# Patient Record
Sex: Male | Born: 1990 | Race: White | Hispanic: No | Marital: Single | Smoking: Never smoker
Health system: Southern US, Community
[De-identification: ages and names within clinical notes are randomized; demographics above are authoritative.]

## PROBLEM LIST (undated history)

## (undated) DIAGNOSIS — I499 Cardiac arrhythmia, unspecified: Secondary | ICD-10-CM

## (undated) DIAGNOSIS — R51 Headache: Secondary | ICD-10-CM

## (undated) DIAGNOSIS — F819 Developmental disorder of scholastic skills, unspecified: Secondary | ICD-10-CM

## (undated) DIAGNOSIS — R202 Paresthesia of skin: Secondary | ICD-10-CM

## (undated) DIAGNOSIS — Z8739 Personal history of other diseases of the musculoskeletal system and connective tissue: Secondary | ICD-10-CM

## (undated) DIAGNOSIS — L989 Disorder of the skin and subcutaneous tissue, unspecified: Secondary | ICD-10-CM

## (undated) DIAGNOSIS — R519 Headache, unspecified: Secondary | ICD-10-CM

## (undated) DIAGNOSIS — K219 Gastro-esophageal reflux disease without esophagitis: Secondary | ICD-10-CM

## (undated) DIAGNOSIS — E669 Obesity, unspecified: Secondary | ICD-10-CM

## (undated) DIAGNOSIS — G4700 Insomnia, unspecified: Secondary | ICD-10-CM

## (undated) DIAGNOSIS — T7840XA Allergy, unspecified, initial encounter: Secondary | ICD-10-CM

## (undated) DIAGNOSIS — F959 Tic disorder, unspecified: Secondary | ICD-10-CM

## (undated) DIAGNOSIS — F419 Anxiety disorder, unspecified: Secondary | ICD-10-CM

## (undated) DIAGNOSIS — B079 Viral wart, unspecified: Secondary | ICD-10-CM

## (undated) HISTORY — DX: Allergy, unspecified, initial encounter: T78.40XA

## (undated) HISTORY — DX: Personal history of other diseases of the musculoskeletal system and connective tissue: Z87.39

## (undated) HISTORY — DX: Tic disorder, unspecified: F95.9

## (undated) HISTORY — DX: Anxiety disorder, unspecified: F41.9

## (undated) HISTORY — DX: Obesity, unspecified: E66.9

## (undated) HISTORY — DX: Gastro-esophageal reflux disease without esophagitis: K21.9

## (undated) HISTORY — DX: Insomnia, unspecified: G47.00

## (undated) HISTORY — DX: Viral wart, unspecified: B07.9

## (undated) HISTORY — DX: Developmental disorder of scholastic skills, unspecified: F81.9

## (undated) HISTORY — DX: Paresthesia of skin: R20.2

## (undated) HISTORY — DX: Disorder of the skin and subcutaneous tissue, unspecified: L98.9

---
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
## 2014-05-06 LAB — LIPID PANEL
CHOLESTEROL: 111 mg/dL (ref 0–200)
HDL: 38 mg/dL (ref 35–70)
LDL Cholesterol: 50 mg/dL
TRIGLYCERIDES: 114 mg/dL (ref 40–160)

## 2015-02-24 ENCOUNTER — Encounter: Payer: Self-pay | Admitting: Family Medicine

## 2015-02-25 ENCOUNTER — Ambulatory Visit (INDEPENDENT_AMBULATORY_CARE_PROVIDER_SITE_OTHER): Payer: 59 | Admitting: Family Medicine

## 2015-02-25 ENCOUNTER — Encounter: Payer: Self-pay | Admitting: Family Medicine

## 2015-02-25 VITALS — BP 122/78 | HR 89 | Temp 98.7°F | Resp 14 | Ht 72.5 in | Wt 228.4 lb

## 2015-02-25 DIAGNOSIS — M549 Dorsalgia, unspecified: Secondary | ICD-10-CM | POA: Diagnosis not present

## 2015-02-25 DIAGNOSIS — E66811 Obesity, class 1: Secondary | ICD-10-CM | POA: Insufficient documentation

## 2015-02-25 DIAGNOSIS — F339 Major depressive disorder, recurrent, unspecified: Secondary | ICD-10-CM | POA: Diagnosis not present

## 2015-02-25 DIAGNOSIS — J302 Other seasonal allergic rhinitis: Secondary | ICD-10-CM | POA: Insufficient documentation

## 2015-02-25 DIAGNOSIS — E669 Obesity, unspecified: Secondary | ICD-10-CM

## 2015-02-25 DIAGNOSIS — F411 Generalized anxiety disorder: Secondary | ICD-10-CM

## 2015-02-25 DIAGNOSIS — J309 Allergic rhinitis, unspecified: Secondary | ICD-10-CM

## 2015-02-25 DIAGNOSIS — J3089 Other allergic rhinitis: Secondary | ICD-10-CM | POA: Insufficient documentation

## 2015-02-25 DIAGNOSIS — G4700 Insomnia, unspecified: Secondary | ICD-10-CM | POA: Insufficient documentation

## 2015-02-25 DIAGNOSIS — K219 Gastro-esophageal reflux disease without esophagitis: Secondary | ICD-10-CM | POA: Diagnosis not present

## 2015-02-25 DIAGNOSIS — F819 Developmental disorder of scholastic skills, unspecified: Secondary | ICD-10-CM

## 2015-02-25 DIAGNOSIS — F958 Other tic disorders: Secondary | ICD-10-CM | POA: Insufficient documentation

## 2015-02-25 HISTORY — DX: Obesity, class 1: E66.811

## 2015-02-25 HISTORY — DX: Obesity, unspecified: E66.9

## 2015-02-25 MED ORDER — ALPRAZOLAM 0.5 MG PO TABS: 0.5000 mg | ORAL_TABLET | Freq: Every day | ORAL | Status: DC | PRN

## 2015-02-25 MED ORDER — FLUTICASONE PROPIONATE 50 MCG/ACT NA SUSP: 2.0000 | Freq: Every day | NASAL | Status: DC

## 2015-02-25 MED ORDER — METAXALONE 800 MG PO TABS: 800.0000 mg | ORAL_TABLET | Freq: Three times a day (TID) | ORAL | Status: DC

## 2015-02-25 NOTE — Patient Instructions (Signed)
Back Pain, Adult Low back pain is very common. About 1 in 5 people have back pain.The cause of low back pain is rarely dangerous. The pain often gets better over time.About half of people with a sudden onset of back pain feel better in just 2 weeks. About 8 in 10 people feel better by 6 weeks.  CAUSES Some common causes of back pain include:  Strain of the muscles or ligaments supporting the spine.  Wear and tear (degeneration) of the spinal discs.  Arthritis.  Direct injury to the back. DIAGNOSIS Most of the time, the direct cause of low back pain is not known.However, back pain can be treated effectively even when the exact cause of the pain is unknown.Answering your caregiver's questions about your overall health and symptoms is one of the most accurate ways to make sure the cause of your pain is not dangerous. If your caregiver needs more information, he or she may order lab work or imaging tests (X-rays or MRIs).However, even if imaging tests show changes in your back, this usually does not require surgery. HOME CARE INSTRUCTIONS For many people, back pain returns.Since low back pain is rarely dangerous, it is often a condition that people can learn to manageon their own.   Remain active. It is stressful on the back to sit or stand in one place. Do not sit, drive, or stand in one place for more than 30 minutes at a time. Take short walks on level surfaces as soon as pain allows.Try to increase the length of time you walk each day.  Do not stay in bed.Resting more than 1 or 2 days can delay your recovery.  Do not avoid exercise or work.Your body is made to move.It is not dangerous to be active, even though your back may hurt.Your back will likely heal faster if you return to being active before your pain is gone.  Pay attention to your body when you bend and lift. Many people have less discomfortwhen lifting if they bend their knees, keep the load close to their bodies,and  avoid twisting. Often, the most comfortable positions are those that put less stress on your recovering back.  Find a comfortable position to sleep. Use a firm mattress and lie on your side with your knees slightly bent. If you lie on your back, put a pillow under your knees.  Only take over-the-counter or prescription medicines as directed by your caregiver. Over-the-counter medicines to reduce pain and inflammation are often the most helpful.Your caregiver may prescribe muscle relaxant drugs.These medicines help dull your pain so you can more quickly return to your normal activities and healthy exercise.  Put ice on the injured area.  Put ice in a plastic bag.  Place a towel between your skin and the bag.  Leave the ice on for 15-20 minutes, 03-04 times a day for the first 2 to 3 days. After that, ice and heat may be alternated to reduce pain and spasms.  Ask your caregiver about trying back exercises and gentle massage. This may be of some benefit.  Avoid feeling anxious or stressed.Stress increases muscle tension and can worsen back pain.It is important to recognize when you are anxious or stressed and learn ways to manage it.Exercise is a great option. SEEK MEDICAL CARE IF:  You have pain that is not relieved with rest or medicine.  You have pain that does not improve in 1 week.  You have new symptoms.  You are generally not feeling well. SEEK   IMMEDIATE MEDICAL CARE IF:   You have pain that radiates from your back into your legs.  You develop new bowel or bladder control problems.  You have unusual weakness or numbness in your arms or legs.  You develop nausea or vomiting.  You develop abdominal pain.  You feel faint. Document Released: 09/04/2005 Document Revised: 03/05/2012 Document Reviewed: 01/06/2014 Exit[employer] Patient Information 2015 [employer]. This information is not intended to replace advice given to you by your health care provider. Make sure you  discuss any questions you have with your health care provider.  

## 2015-02-25 NOTE — Progress Notes (Signed)
Name: Mason Parker   MRN: [id number]    DOB: 03-30-1991   Date:02/25/2015       Progress Note  Subjective  Chief Complaint  Chief Complaint  Patient presents with  . Anxiety    unchanged  . Back Pain    upper back since sunday.  He has been shouveling gravel/dirt and doing yard work    HPI  Acute low back pain: he was shoveling gravel and dirt on Sunday and that evening he noticed back pain on left upper back pain, described as an aching feeling, worse with movement/activity and better with rest and heating pad applications. Pain at this time is 3/10, but can get up to 9/10. Pain does not radiated anywhere.  GAD: he has a new puppy and wants to train the puppy for stress relieve, he has been off paroxetine, and has only been taking alprazolam prn, but he needs follow up. He continues to see therapist , Rob at [facility], every two weeks.   GERD: it is controlled with life style modification, not current taking medication  Depression: he states he does not have friends his age, and he feels lonely, denies suicidal thoughts or ideation. He was taking Paroxetine, but stopped, he wants to be helped by his emotional support animal. He prefers not to take medication, and continue counseling     Patient Active Problem List   Diagnosis Date Noted  . Anxiety, generalized 02/25/2015  . Insomnia 02/25/2015  . Gastro-esophageal reflux disease without esophagitis 02/25/2015  . Chronic recurrent major depressive disorder 02/25/2015  . Obesity (BMI 30.0-34.9) 02/25/2015  . 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 Perennial allergic rhinitis with seasonal variation 02/25/2015  . Learning disability 02/25/2015    Past Surgical History  Procedure Laterality Date  . Circumcision  04/11/2007    Family History  Problem Relation Age of Onset  . Hypertension Mother   . Heart disease Mother   . Depression Mother   . Hypertension Father   . Bipolar disorder Father   .  Hyperlipidemia Father   . Obesity Father   . Bipolar disorder Sister   . Depression Brother     History   Social History  . Marital Status: Single    Spouse Name: N/A  . Number of Children: N/A  . Years of Education: N/A   Occupational History  . Not on file.   Social History Main Topics  . Smoking status: Never Smoker   . Smokeless tobacco: Never Used  . Alcohol Use: No  . Drug Use: No  . Sexual Activity: Not Currently   Other Topics Concern  . Not on file   Social History Narrative     Current outpatient prescriptions:  .  ALPRAZolam (XANAX) 0.5 MG tablet, Take 1 tablet (0.5 mg total) by mouth daily as needed for anxiety., Disp: 30 tablet, Rfl: 0 .  loratadine (CLARITIN) 10 MG tablet, Take 1 tablet by mouth daily., Disp: , Rfl:  .  fluticasone (FLONASE) 50 MCG/ACT nasal spray, Place 2 sprays into both nostrils daily., Disp: 16 g, Rfl: 5  Allergies  Allergen Reactions  . Shellfish Allergy Hives     ROS  Constitutional: Negative for fever but mild weight change - gain.  Respiratory: Negative for cough and shortness of breath.   Cardiovascular: Negative for chest pain or palpitations.  Gastrointestinal: Negative for abdominal pain, no bowel changes.  Musculoskeletal: Negative for gait problem or joint swelling.  Skin: Negative for rash.  Neurological:  Negative for dizziness or headache.  No other specific complaints in a complete review of systems (except as listed in HPI above).  Objective  Filed Vitals:   02/25/15 1459  BP: 122/78  Pulse: 89  Temp: 98.7 F (37.1 C)  TempSrc: Oral  Resp: 14  Height: 6' 0.5" (1.842 m)  Weight: 228 lb 6.4 oz (103.602 kg)  SpO2: 97%    Body mass index is 30.53 kg/(m^2).  Physical Exam  Constitutional: Patient appears well-developed and well-nourished. No distress.  HENT: Head: Normocephalic and atraumatic. Nose: Nose normal. Mouth/Throat: Oropharynx is clear and moist. No oropharyngeal exudate.  Eyes:  Conjunctivae and EOM are normal. Pupils are equal, round, and reactive to light. No scleral icterus.  Neck: Normal range of motion. Neck supple. No JVD present. No thyromegaly present.  Cardiovascular: Normal rate, regular rhythm and normal heart sounds.  No murmur heard. No BLE edema. Pulmonary/Chest: Effort normal and breath sounds normal. No respiratory distress. Abdominal: Soft. Bowel sounds are normal, no distension. There is no tenderness. no masses Musculoskeletal: Normal range of motion, no joint effusions. No gross deformities, mild pain during palpation of left upper back Neurological: he is alert and oriented to person, place, and time. No cranial nerve deficit. Coordination, balance, strength, speech and gait are normal.  Skin: Skin is warm and dry. No rash noted. No erythema.  Psychiatric: Patient has a normal mood and affect. behavior is normal. Judgment and thought content normal.  PHQ2/9: Depression screen PHQ 2/9 02/25/2015  Decreased Interest 1  Down, Depressed, Hopeless 2  PHQ - 2 Score 3  Altered sleeping 3  Tired, decreased energy 3  Change in appetite 1  Feeling bad or failure about yourself  1  Trouble concentrating 0  Moving slowly or fidgety/restless 1  Suicidal thoughts 0  PHQ-9 Score 12  Difficult doing work/chores Somewhat difficult     Fall Risk: Fall Risk  02/25/2015  Falls in the past year? No    Assessment & Plan   1. Acute back pain Advised heating pad and skelaxin for spasms - metaxalone (SKELAXIN) 800 MG tablet; Take 1 tablet (800 mg total) by mouth 3 (three) times daily.  Dispense: 30 tablet; Refill: 0  2. Chronic recurrent major depressive disorder  Using his dog as an emotional dog support - he is off paroxetine and does not want to take SSRI's at this time, continue follow up with therapist 3. Gastro-esophageal reflux disease without esophagitis  controlled with life style modification  4. Anxiety, generalized Taking medication prn -  ALPRAZolam (XANAX) 0.5 MG tablet; Take 1 tablet (0.5 mg total) by mouth daily as needed for anxiety.  Dispense: 30 tablet; Refill: 0  5. Learning disability [facility], he still requires extended time for tests  6. Perennial allergic rhinitis with seasonal variation  - fluticasone (FLONASE) 50 MCG/ACT nasal spray; Place 2 sprays into both nostrils daily.  Dispense: 16 g; Refill: 5

## 2015-05-05 ENCOUNTER — Ambulatory Visit (INDEPENDENT_AMBULATORY_CARE_PROVIDER_SITE_OTHER): Payer: 59 | Admitting: Family Medicine

## 2015-05-05 ENCOUNTER — Encounter: Payer: Self-pay | Admitting: Family Medicine

## 2015-05-05 VITALS — BP 138/78 | HR 116 | Temp 98.7°F | Resp 16 | Ht 73.0 in | Wt 230.4 lb

## 2015-05-05 DIAGNOSIS — L0201 Cutaneous abscess of face: Secondary | ICD-10-CM

## 2015-05-05 DIAGNOSIS — L03211 Cellulitis of face: Secondary | ICD-10-CM | POA: Diagnosis not present

## 2015-05-05 MED ORDER — AMOXICILLIN-POT CLAVULANATE 875-125 MG PO TABS: 1.0000 | ORAL_TABLET | Freq: Two times a day (BID) | ORAL | Status: DC

## 2015-05-05 NOTE — Progress Notes (Signed)
Name: Mason Parker   MRN: [id number]    DOB: 1991-05-27   Date:05/05/2015       Progress Note  Subjective  Chief Complaint  Chief Complaint  Patient presents with  . Facial Swelling    facial swelling on left side possible due to ingrown hair for 2 days.    HPI  Patient presents with a 5-7 day history of pain and swelling in the right mandibular area. He is well-appearing and thinks he had an ingrown hair as well. This been no fever or chills myalgias. He denies any oral lesions or dental problems.    Past Medical History  Diagnosis Date  . Learning disability   . Personal history of dislocation of shoulder   . Viral wart   . GERD (gastroesophageal reflux disease)   . Anxiety   . Allergy   . Insomnia   . Tic disorder   . Skin lesion   . Right hand paresthesia   . Obesity (BMI 30.0-34.9) 02/25/2015    Social History  Substance Use Topics  . Smoking status: Never Smoker   . Smokeless tobacco: Never Used  . Alcohol Use: No     Current outpatient prescriptions:  .  ALPRAZolam (XANAX) 0.5 MG tablet, Take 1 tablet (0.5 mg total) by mouth daily as needed for anxiety., Disp: 30 tablet, Rfl: 0 .  fluticasone (FLONASE) 50 MCG/ACT nasal spray, Place 2 sprays into both nostrils daily., Disp: 16 g, Rfl: 5 .  loratadine (CLARITIN) 10 MG tablet, Take 1 tablet by mouth daily., Disp: , Rfl:  .  metaxalone (SKELAXIN) 800 MG tablet, Take 1 tablet (800 mg total) by mouth 3 (three) times daily. (Patient not taking: Reported on 05/05/2015), Disp: 30 tablet, Rfl: 0  Allergies  Allergen Reactions  . Shellfish Allergy Hives    Review of Systems  Constitutional: Negative for fever, chills, weight loss and malaise/fatigue.  HENT:       Negative for dental or gingival pain  Eyes: Negative.   Cardiovascular: Negative.   Skin: Positive for rash.       Right facial lesion     Objective  Filed Vitals:   05/05/15 0958  BP: 138/78  Pulse: 116  Temp: 98.7 F (37.1 C)  Resp: 16   Height:  (1.854 m)  Weight: 230 lb 7 oz (104.526 kg)  SpO2: 97%     Physical Exam    Assessment & Plan  1. Cellulitis and abscess of face Differential diagnosis includes simple abscess and ingrown hair versus ENT mucocyst or malignancy which is much less likely - amoxicillin-clavulanate (AUGMENTIN) 875-125 MG per tablet; Take 1 tablet by mouth 2 (two) times daily.  Dispense: 20 tablet; Refill: 0

## 2015-05-05 NOTE — Patient Instructions (Signed)

## 2015-06-03 ENCOUNTER — Ambulatory Visit (INDEPENDENT_AMBULATORY_CARE_PROVIDER_SITE_OTHER): Payer: 59 | Admitting: Family Medicine

## 2015-06-03 ENCOUNTER — Encounter: Payer: Self-pay | Admitting: Family Medicine

## 2015-06-03 VITALS — BP 132/84 | HR 102 | Temp 98.8°F | Resp 16 | Ht 73.0 in | Wt 227.9 lb

## 2015-06-03 DIAGNOSIS — F411 Generalized anxiety disorder: Secondary | ICD-10-CM | POA: Diagnosis not present

## 2015-06-03 DIAGNOSIS — F819 Developmental disorder of scholastic skills, unspecified: Secondary | ICD-10-CM

## 2015-06-03 DIAGNOSIS — Z23 Encounter for immunization: Secondary | ICD-10-CM | POA: Diagnosis not present

## 2015-06-03 DIAGNOSIS — E669 Obesity, unspecified: Secondary | ICD-10-CM

## 2015-06-03 DIAGNOSIS — F339 Major depressive disorder, recurrent, unspecified: Secondary | ICD-10-CM | POA: Diagnosis not present

## 2015-06-03 DIAGNOSIS — K219 Gastro-esophageal reflux disease without esophagitis: Secondary | ICD-10-CM | POA: Diagnosis not present

## 2015-06-03 DIAGNOSIS — S43004S Unspecified dislocation of right shoulder joint, sequela: Secondary | ICD-10-CM

## 2015-06-03 DIAGNOSIS — B078 Other viral warts: Secondary | ICD-10-CM | POA: Insufficient documentation

## 2015-06-03 DIAGNOSIS — B079 Viral wart, unspecified: Secondary | ICD-10-CM | POA: Insufficient documentation

## 2015-06-03 NOTE — Progress Notes (Signed)
Name: Mason Parker   MRN: [id number]    DOB: January 06, 1991   Date:06/03/2015       Progress Note  Subjective  Chief Complaint  Chief Complaint  Patient presents with  . Medication Refill    3 month F/U  . Anxiety  . Shoulder Pain    not realy painful just pops out os socket request to see surgeon    HPI    Right shoulder dislocation history and instability: he injured the right shoulder 10 years ago , had PT done at the time and continues to have intermittent dislocation of right shoulder, not painful, but he would like to see Ortho to see if anything else can be done  GERD: controlled with life style modification, has a flare when he eats spicy food/ or citric food. Taking Zantac or Tums prn , at most twice weekly.   Obesity: lost a few pounds since last visit, by eating healthy meals. States he will go back to exercising when it gets cooler outside  Anxiety/Depression: still seeing therapist, doing well, dating again - back with his first girlfriend - Mason Parker - only took a few tablets of Alprazolam since last visit. Off SSRI, but does not want to go back. He gets emotional at times.    Patient Active Problem List   Diagnosis Date Noted  . Verruca warts (infectious) 06/03/2015  . Anxiety, generalized 02/25/2015  . Insomnia 02/25/2015  . Gastro-esophageal reflux disease without esophagitis 02/25/2015  . Chronic recurrent major depressive disorder 02/25/2015  . Obesity (BMI 30.0-34.9) 02/25/2015  . 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 Perennial allergic rhinitis with seasonal variation 02/25/2015  . Learning disability 02/25/2015    Past Surgical History  Procedure Laterality Date  . Circumcision  04/11/2007    Family History  Problem Relation Age of Onset  . Hypertension Mother   . Heart disease Mother   . Depression Mother   . Hypertension Father   . Bipolar disorder Father   . Hyperlipidemia Father   . Obesity Father   . Bipolar disorder Sister   . Depression Brother      Social History   Social History  . Marital Status: Single    Spouse Name: N/A  . Number of Children: N/A  . Years of Education: N/A   Occupational History  . Not on file.   Social History Main Topics  . Smoking status: Never Smoker   . Smokeless tobacco: Never Used  . Alcohol Use: No  . Drug Use: No  . Sexual Activity: Not Currently   Other Topics Concern  . Not on file   Social History Narrative     Current outpatient prescriptions:  .  ALPRAZolam (XANAX) 0.5 MG tablet, Take 1 tablet (0.5 mg total) by mouth daily as needed for anxiety., Disp: 30 tablet, Rfl: 0 .  fluticasone (FLONASE) 50 MCG/ACT nasal spray, Place 2 sprays into both nostrils daily., Disp: 16 g, Rfl: 5 .  loratadine (CLARITIN) 10 MG tablet, Take 1 tablet by mouth daily., Disp: , Rfl:   Allergies  Allergen Reactions  . Shellfish Allergy Hives     ROS  Constitutional: Negative for fever or significant weight change.  Respiratory: Negative for cough and shortness of breath.   Cardiovascular: Negative for chest pain or palpitations.  Gastrointestinal: Negative for abdominal pain, no bowel changes.  Musculoskeletal: Negative for gait problem or joint swelling.  Skin: Negative for rash.  Neurological: Negative for dizziness or headache.  No other specific complaints in a  complete review of systems (except as listed in HPI above).  Objective  Filed Vitals:   06/03/15 1409  BP: 132/84  Pulse: 102  Temp: 98.8 F (37.1 C)  TempSrc: Oral  Resp: 16  Height:  (1.854 m)  Weight: 227 lb 14.4 oz (103.375 kg)  SpO2: 97%    Body mass index is 30.07 kg/(m^2).  Physical Exam  Constitutional: Patient appears well-developed and well-nourished. Obese No distress.  HEENT: head atraumatic, normocephalic, pupils equal and reactive to light, neck supple, throat within normal limits Cardiovascular: Normal rate, regular rhythm and normal heart sounds.  No murmur heard. No BLE  edema. Pulmonary/Chest: Effort normal and breath sounds normal. No respiratory distress. Abdominal: Soft.  There is no tenderness. Psychiatric: Patient has a normal mood and affect. behavior is normal. Judgment and thought content normal.  PHQ2/9: Depression screen [facility] 2/9 05/05/2015 02/25/2015  Decreased Interest 0 1  Down, Depressed, Hopeless 0 2  PHQ - 2 Score 0 3  Altered sleeping - 3  Tired, decreased energy - 3  Change in appetite - 1  Feeling bad or failure about yourself  - 1  Trouble concentrating - 0  Moving slowly or fidgety/restless - 1  Suicidal thoughts - 0  PHQ-9 Score - 12  Difficult doing work/chores - Somewhat difficult    Fall Risk: Fall Risk  05/05/2015 02/25/2015  Falls in the past year? No No     Assessment & Plan  1. Gastro-esophageal reflux disease without esophagitis  Continue prn medication and life style modification   2. Anxiety, generalized  Continue follow with therapist, doing better  3. Needs flu shot  - Flu Vaccine QUAD 36+ mos PF IM (Fluarix & Fluzone Quad PF)  4. Learning disability  He is in school - college - he has a 504 filled out - extended time for testing, and can sometimes have read aloud for him   5. Chronic recurrent major depressive disorder  stable , off medication  6. Obesity (BMI 30.0-34.9)  Continue life style modification

## 2015-06-16 ENCOUNTER — Other Ambulatory Visit: Payer: Self-pay | Admitting: Orthopedic Surgery

## 2015-06-16 DIAGNOSIS — M24411 Recurrent dislocation, right shoulder: Secondary | ICD-10-CM

## 2015-06-30 ENCOUNTER — Ambulatory Visit
Admission: RE | Admit: 2015-06-30 | Discharge: 2015-06-30 | Disposition: A | Discharge status: Home / Self Care | Payer: 59 | Source: Ambulatory Visit | Attending: Orthopedic Surgery | Admitting: Orthopedic Surgery

## 2015-06-30 ENCOUNTER — Other Ambulatory Visit: Payer: Self-pay

## 2015-06-30 DIAGNOSIS — M24411 Recurrent dislocation, right shoulder: Secondary | ICD-10-CM | POA: Insufficient documentation

## 2015-06-30 DIAGNOSIS — S43491A Other sprain of right shoulder joint, initial encounter: Secondary | ICD-10-CM | POA: Insufficient documentation

## 2015-06-30 DIAGNOSIS — X58XXXA Exposure to other specified factors, initial encounter: Secondary | ICD-10-CM | POA: Diagnosis not present

## 2015-06-30 MED ORDER — SODIUM CHLORIDE 0.9 % IJ SOLN: 5.0000 mL | INTRAMUSCULAR | Status: DC | PRN

## 2015-06-30 MED ORDER — IOHEXOL 180 MG/ML  SOLN
0.5000 mL | Freq: Once | INTRAMUSCULAR | Status: DC | PRN
  Administered 2015-06-30: 0.5 mL via INTRA_ARTICULAR
  Filled 2015-06-30: qty 20

## 2015-06-30 MED ORDER — GADOBENATE DIMEGLUMINE 529 MG/ML IV SOLN
0.1000 mL | Freq: Once | INTRAVENOUS | Status: DC | PRN
  Administered 2015-06-30: 0.1 mL via INTRA_ARTICULAR
  Filled 2015-06-30: qty 5

## 2015-07-16 ENCOUNTER — Encounter: Payer: Self-pay | Admitting: Family Medicine

## 2015-07-16 DIAGNOSIS — M24411 Recurrent dislocation, right shoulder: Secondary | ICD-10-CM | POA: Insufficient documentation

## 2015-08-25 ENCOUNTER — Encounter: Payer: Self-pay | Admitting: *Deleted

## 2015-08-25 ENCOUNTER — Other Ambulatory Visit: Payer: 59

## 2015-08-25 NOTE — Patient Instructions (Signed)
  Your procedure is scheduled on: 09-09-15 (THURSDAY) Report to [facility] 2ND FLOOR To find out your arrival time please call [phone] between 1PM - 3PM on 09-08-15 [facility](WEDNESDAY)  Remember: Instructions that are not followed completely may result in serious medical risk, up to and including death, or upon the discretion of your surgeon and anesthesiologist your surgery may need to be rescheduled.    __X__ 1. Do not eat food or drink liquids after midnight. No gum chewing or hard candies.     __X__ 2. No Alcohol for 24 hours before or after surgery.   ____ 3. Bring all medications with you on the day of surgery if instructed.    ____ 4. Notify your doctor if there is any change in your medical condition     (cold, fever, infections).     Do not wear jewelry, make-up, hairpins, clips or nail polish.  Do not wear lotions, powders, or perfumes. You may wear deodorant.  Do not shave 48 hours prior to surgery. Men may shave face and neck.  Do not bring valuables to the hospital.    [facility] not responsible for any belongings or valuables.               Contacts, dentures or bridgework may not be worn into surgery.  Leave your suitcase in the car. After surgery it may be brought to your room.  For patients admitted to the hospital, discharge time is determined by your treatment team.   Patients discharged the day of surgery will not be allowed to drive home.   Please read over the following fact sheets that you were given:      _X___ Take these medicines the morning of surgery with A SIP OF WATER:    1. MAY TAKE XANAX IF NEEDED  2.   3.   4.  5.  6.  ____ Fleet Enema (as directed)   _X___ Use CHG Soap as directed  ____ Use inhalers on the day of surgery  ____ Stop metformin 2 days prior to surgery    ____ Take 1/2 of usual insulin dose the night before surgery and none on the morning of surgery.   ____ Stop Coumadin/Plavix/aspirin-N/A  ____  Stop Anti-inflammatories-NO NSAIDS OR ASPIRIN PRODUCTS-TYLENOL OK   ____ Stop supplements until after surgery.    ____ Bring C-Pap to the hospital.

## 2015-08-30 ENCOUNTER — Ambulatory Visit (INDEPENDENT_AMBULATORY_CARE_PROVIDER_SITE_OTHER): Payer: 59 | Admitting: Family Medicine

## 2015-08-30 ENCOUNTER — Encounter: Payer: Self-pay | Admitting: Family Medicine

## 2015-08-30 ENCOUNTER — Encounter
Admission: RE | Admit: 2015-08-30 | Discharge: 2015-08-30 | Disposition: A | Discharge status: Home / Self Care | Payer: 59 | Source: Ambulatory Visit | Attending: Orthopedic Surgery | Admitting: Orthopedic Surgery

## 2015-08-30 VITALS — BP 138/92 | HR 99 | Temp 99.0°F | Resp 16 | Ht 72.0 in | Wt 232.7 lb

## 2015-08-30 DIAGNOSIS — R030 Elevated blood-pressure reading, without diagnosis of hypertension: Secondary | ICD-10-CM

## 2015-08-30 DIAGNOSIS — F339 Major depressive disorder, recurrent, unspecified: Secondary | ICD-10-CM

## 2015-08-30 DIAGNOSIS — IMO0001 Reserved for inherently not codable concepts without codable children: Secondary | ICD-10-CM

## 2015-08-30 DIAGNOSIS — M24411 Recurrent dislocation, right shoulder: Secondary | ICD-10-CM

## 2015-08-30 DIAGNOSIS — Z01812 Encounter for preprocedural laboratory examination: Secondary | ICD-10-CM | POA: Diagnosis present

## 2015-08-30 DIAGNOSIS — F411 Generalized anxiety disorder: Secondary | ICD-10-CM

## 2015-08-30 LAB — URINALYSIS COMPLETE WITH MICROSCOPIC (ARMC ONLY)
BACTERIA UA: NONE SEEN
Bilirubin Urine: NEGATIVE
GLUCOSE, UA: NEGATIVE mg/dL
Ketones, ur: NEGATIVE mg/dL
Leukocytes, UA: NEGATIVE
Nitrite: NEGATIVE
PROTEIN: NEGATIVE mg/dL
SPECIFIC GRAVITY, URINE: 1.026 (ref 1.005–1.030)
SQUAMOUS EPITHELIAL / LPF: NONE SEEN
pH: 6 (ref 5.0–8.0)

## 2015-08-30 LAB — CBC
HCT: 44.2 % (ref 40.0–52.0)
Hemoglobin: 15.2 g/dL (ref 13.0–18.0)
MCH: 29 pg (ref 26.0–34.0)
MCHC: 34.4 g/dL (ref 32.0–36.0)
MCV: 84.2 fL (ref 80.0–100.0)
PLATELETS: 289 10*3/uL (ref 150–440)
RBC: 5.25 MIL/uL (ref 4.40–5.90)
RDW: 12.4 % (ref 11.5–14.5)
WBC: 16.8 10*3/uL — ABNORMAL HIGH (ref 3.8–10.6)

## 2015-08-30 LAB — BASIC METABOLIC PANEL
Anion gap: 8 (ref 5–15)
BUN: 21 mg/dL — AB (ref 6–20)
CALCIUM: 9.3 mg/dL (ref 8.9–10.3)
CHLORIDE: 108 mmol/L (ref 101–111)
CO2: 25 mmol/L (ref 22–32)
CREATININE: 1.25 mg/dL — AB (ref 0.61–1.24)
GFR calc non Af Amer: >60 mL/min (ref >60)
Glucose, Bld: 95 mg/dL (ref 65–99)
Potassium: 3.6 mmol/L (ref 3.5–5.1)
SODIUM: 141 mmol/L (ref 135–145)

## 2015-08-30 LAB — APTT: aPTT: 30 seconds (ref 24–36)

## 2015-08-30 LAB — PROTIME-INR
INR: 1.09
PROTHROMBIN TIME: 14.3 s (ref 11.4–15.0)

## 2015-08-30 MED ORDER — ALPRAZOLAM ER 0.5 MG PO TB24: 0.5000 mg | ORAL_TABLET | Freq: Every day | ORAL | Status: DC

## 2015-08-30 MED ORDER — PAROXETINE HCL 40 MG PO TABS: 40.0000 mg | ORAL_TABLET | Freq: Every day | ORAL | Status: DC

## 2015-08-30 NOTE — Progress Notes (Signed)
Name: Mason Parker   MRN: [id number]    DOB: 12/06/1990   Date:08/30/2015       Progress Note  Subjective  Chief Complaint  Chief Complaint  Patient presents with  . Medication Refill    follow-up  . Depression    not currently on  . Anxiety    HPI   Right shoulder dislocation history and instability: he injured the right shoulder 10 years ago , had PT done at the time and continues to have intermittent dislocation of right shoulder, seen by Ortho - Dr. Martha Clan  and will have surgery next week.   GERD: controlled with life style modification, has a flare when he eats spicy food/ or citric food. Taking Zantac or Tums prn , at most twice weekly.   Obesity: he has gained a lot of weight since last visit. He states he is not following a healthy diet lately.   Major Depression and GAD: still seeing therapist Not currently dating [name] again - casually seeing each other. He has stopped taking all his medications. States he has forgotten to take it, but wants to start it again. Getting more anxious again, having surgery next week, also worried about his finals at school.   Patient Active Problem List   Diagnosis Date Noted  . Recurrent dislocation of right shoulder 07/16/2015  . Verruca warts (infectious) 06/03/2015  . Anxiety, generalized 02/25/2015  . Insomnia 02/25/2015  . Gastro-esophageal reflux disease without esophagitis 02/25/2015  . Chronic recurrent major depressive disorder (HCC) 02/25/2015  . Obesity (BMI 30.0-34.9) 02/25/2015  . 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 Perennial allergic rhinitis with seasonal variation 02/25/2015  . Learning disability 02/25/2015    Past Surgical History  Procedure Laterality Date  . No past surgeries    . Circumcision  1991    Family History  Problem Relation Age of Onset  . Hypertension Mother   . Heart disease Mother   . Depression Mother   . Hypertension Father   . Bipolar disorder Father   . Hyperlipidemia Father   .  Obesity Father   . Bipolar disorder Sister   . Depression Brother     Social History   Social History  . Marital Status: Single    Spouse Name: N/A  . Number of Children: N/A  . Years of Education: N/A   Occupational History  . Not on file.   Social History Main Topics  . Smoking status: Never Smoker   . Smokeless tobacco: Never Used  . Alcohol Use: No  . Drug Use: No  . Sexual Activity: Not Currently   Other Topics Concern  . Not on file   Social History Narrative     Current outpatient prescriptions:  .  ALPRAZolam (XANAX XR) 0.5 MG 24 hr tablet, Take 1 tablet (0.5 mg total) by mouth daily., Disp: 30 tablet, Rfl: 2 .  ALPRAZolam (XANAX) 0.5 MG tablet, Take 1 tablet (0.5 mg total) by mouth daily as needed for anxiety., Disp: 30 tablet, Rfl: 0 .  PARoxetine (PAXIL) 40 MG tablet, Take 1 tablet (40 mg total) by mouth at bedtime., Disp: 30 tablet, Rfl: 2  Allergies  Allergen Reactions  . Other Swelling    YOGURT  . Shellfish Allergy Hives     ROS  Constitutional: Negative for fever, positive for  weight change.  Respiratory: Negative for cough and shortness of breath.   Cardiovascular: Negative for chest pain or palpitations.  Gastrointestinal: Negative for abdominal pain, no bowel changes.  Musculoskeletal:  Negative for gait problem or joint swelling.  Skin: Negative for rash.  Neurological: Negative for dizziness or headache.  No other specific complaints in a complete review of systems (except as listed in HPI above).  Objective  Filed Vitals:   08/30/15 1452  BP: 142/84  Pulse: 99  Temp: 99 F (37.2 C)  TempSrc: Oral  Resp: 16  Height: 6' (1.829 m)  Weight: 232 lb 11.2 oz (105.552 kg)  SpO2: 97%    Body mass index is 31.55 kg/(m^2).  Physical Exam  Constitutional: Patient appears well-developed and well-nourished. Obese No distress.  HEENT: head atraumatic, normocephalic, pupils equal and reactive to light, neck supple, throat within normal  limits Cardiovascular: Normal rate, regular rhythm and normal heart sounds.  No murmur heard. No BLE edema. Pulmonary/Chest: Effort normal and breath sounds normal. No respiratory distress. Abdominal: Soft.  There is no tenderness. Psychiatric: Patient has a normal mood and affect. behavior is normal. Judgment and thought content normal. Muscular Skeletal: normal rom of right shoulder, some popping with rom.   PHQ2/9: Depression screen [facility] 05/05/2015 02/25/2015  Decreased Interest 0 1  Down, Depressed, Hopeless 0 2  PHQ - 2 Score 0 3  Altered sleeping - 3  Tired, decreased energy - 3  Change in appetite - 1  Feeling bad or failure about yourself  - 1  Trouble concentrating - 0  Moving slowly or fidgety/restless - 1  Suicidal thoughts - 0  PHQ-9 Score - 12  Difficult doing work/chores - Somewhat difficult    Fall Risk: Fall Risk  05/05/2015 02/25/2015  Falls in the past year? No No     Assessment & Plan  1. Recurrent dislocation of right shoulder  Having surgery next week   2. Anxiety, generalized  He would like to try Xanax XR instead of prn if possible - ALPRAZolam (XANAX XR) 0.5 MG 24 hr tablet; Take 1 tablet (0.5 mg total) by mouth daily.  Dispense: 30 tablet; Refill: 2 - PARoxetine (PAXIL) 40 MG tablet; Take 1 tablet (40 mg total) by mouth at bedtime.  Dispense: 30 tablet; Refill: 2  3. Chronic recurrent major depressive disorder (HCC)  Resume Paroxetine, and continue follow up with therapist  4. Elevated blood pressure  He is very stressed out today, usually within normal limits

## 2015-08-31 NOTE — Pre-Procedure Instructions (Signed)
Abnormal labs faxed to Dr Samuel GermanyKrasinski's office-elevated wbc @ 16.8

## 2015-09-09 ENCOUNTER — Ambulatory Visit: Payer: 59 | Admitting: Anesthesiology

## 2015-09-09 ENCOUNTER — Encounter: Payer: Self-pay | Admitting: *Deleted

## 2015-09-09 ENCOUNTER — Ambulatory Visit
Admission: RE | Admit: 2015-09-09 | Discharge: 2015-09-09 | Disposition: A | Discharge status: Home / Self Care | Payer: 59 | Source: Ambulatory Visit | Attending: Orthopedic Surgery | Admitting: Orthopedic Surgery

## 2015-09-09 ENCOUNTER — Encounter
Admission: RE | Disposition: A | Discharge status: Home / Self Care | Payer: Self-pay | Source: Ambulatory Visit | Attending: Orthopedic Surgery

## 2015-09-09 DIAGNOSIS — Z91018 Allergy to other foods: Secondary | ICD-10-CM | POA: Diagnosis not present

## 2015-09-09 DIAGNOSIS — F819 Developmental disorder of scholastic skills, unspecified: Secondary | ICD-10-CM | POA: Insufficient documentation

## 2015-09-09 DIAGNOSIS — K219 Gastro-esophageal reflux disease without esophagitis: Secondary | ICD-10-CM | POA: Insufficient documentation

## 2015-09-09 DIAGNOSIS — Z91013 Allergy to seafood: Secondary | ICD-10-CM | POA: Diagnosis not present

## 2015-09-09 DIAGNOSIS — Z6830 Body mass index (BMI) 30.0-30.9, adult: Secondary | ICD-10-CM | POA: Insufficient documentation

## 2015-09-09 DIAGNOSIS — G4700 Insomnia, unspecified: Secondary | ICD-10-CM | POA: Diagnosis not present

## 2015-09-09 DIAGNOSIS — F959 Tic disorder, unspecified: Secondary | ICD-10-CM | POA: Insufficient documentation

## 2015-09-09 DIAGNOSIS — F419 Anxiety disorder, unspecified: Secondary | ICD-10-CM | POA: Insufficient documentation

## 2015-09-09 DIAGNOSIS — R208 Other disturbances of skin sensation: Secondary | ICD-10-CM | POA: Diagnosis not present

## 2015-09-09 DIAGNOSIS — R51 Headache: Secondary | ICD-10-CM | POA: Insufficient documentation

## 2015-09-09 DIAGNOSIS — M24411 Recurrent dislocation, right shoulder: Secondary | ICD-10-CM | POA: Diagnosis not present

## 2015-09-09 DIAGNOSIS — R002 Palpitations: Secondary | ICD-10-CM | POA: Diagnosis not present

## 2015-09-09 DIAGNOSIS — E669 Obesity, unspecified: Secondary | ICD-10-CM | POA: Diagnosis not present

## 2015-09-09 DIAGNOSIS — Z79899 Other long term (current) drug therapy: Secondary | ICD-10-CM | POA: Insufficient documentation

## 2015-09-09 HISTORY — DX: Cardiac arrhythmia, unspecified: I49.9

## 2015-09-09 HISTORY — PX: SHOULDER ARTHROSCOPY: SHX128

## 2015-09-09 HISTORY — DX: Headache: R51

## 2015-09-09 HISTORY — DX: Headache, unspecified: R51.9

## 2015-09-09 SURGERY — ARTHROSCOPY, SHOULDER
Anesthesia: General | Site: Shoulder | Laterality: Right | Wound class: Clean

## 2015-09-09 MED ORDER — PROPOFOL 10 MG/ML IV BOLUS
INTRAVENOUS | Status: DC | PRN
  Administered 2015-09-09: 200 mg via INTRAVENOUS

## 2015-09-09 MED ORDER — CEFAZOLIN SODIUM-DEXTROSE 2-3 GM-% IV SOLR
2.0000 g | Freq: Once | INTRAVENOUS | Status: AC
  Administered 2015-09-09: 2 g via INTRAVENOUS

## 2015-09-09 MED ORDER — EPINEPHRINE HCL 1 MG/ML IJ SOLN
INTRAMUSCULAR | Status: DC | PRN
  Administered 2015-09-09: 27 mg

## 2015-09-09 MED ORDER — BUPIVACAINE HCL (PF) 0.25 % IJ SOLN
INTRAMUSCULAR | Status: AC
  Filled 2015-09-09: qty 30

## 2015-09-09 MED ORDER — EPHEDRINE SULFATE 50 MG/ML IJ SOLN
INTRAMUSCULAR | Status: DC | PRN
  Administered 2015-09-09: 5 mg via INTRAVENOUS

## 2015-09-09 MED ORDER — OXYCODONE HCL 5 MG PO TABS
ORAL_TABLET | ORAL | Status: AC
  Filled 2015-09-09: qty 1

## 2015-09-09 MED ORDER — OXYCODONE HCL 5 MG PO TABS: 5.0000 mg | ORAL_TABLET | ORAL | Status: DC | PRN

## 2015-09-09 MED ORDER — ACETAMINOPHEN 10 MG/ML IV SOLN
INTRAVENOUS | Status: AC
  Filled 2015-09-09: qty 100

## 2015-09-09 MED ORDER — CEFAZOLIN SODIUM-DEXTROSE 2-3 GM-% IV SOLR
INTRAVENOUS | Status: AC
  Filled 2015-09-09: qty 50

## 2015-09-09 MED ORDER — ONDANSETRON HCL 4 MG/2ML IJ SOLN
INTRAMUSCULAR | Status: AC
  Filled 2015-09-09: qty 2

## 2015-09-09 MED ORDER — BUPIVACAINE HCL 0.25 % IJ SOLN
INTRAMUSCULAR | Status: DC | PRN
  Administered 2015-09-09: 30 mL

## 2015-09-09 MED ORDER — SODIUM CHLORIDE 0.9 % IJ SOLN
INTRAMUSCULAR | Status: AC
  Filled 2015-09-09: qty 10

## 2015-09-09 MED ORDER — NEOMYCIN-POLYMYXIN B GU 40-200000 IR SOLN
Status: AC
  Filled 2015-09-09: qty 2

## 2015-09-09 MED ORDER — FAMOTIDINE 20 MG PO TABS
ORAL_TABLET | ORAL | Status: AC
  Filled 2015-09-09: qty 1

## 2015-09-09 MED ORDER — LIDOCAINE HCL (PF) 1 % IJ SOLN
INTRAMUSCULAR | Status: AC
Start: 2015-09-09 — End: 2015-09-09
  Filled 2015-09-09: qty 30

## 2015-09-09 MED ORDER — LACTATED RINGERS IV SOLN
INTRAVENOUS | Status: DC
  Administered 2015-09-09 (×3): via INTRAVENOUS

## 2015-09-09 MED ORDER — ONDANSETRON HCL 4 MG/2ML IJ SOLN
INTRAMUSCULAR | Status: DC | PRN
  Administered 2015-09-09: 4 mg via INTRAVENOUS

## 2015-09-09 MED ORDER — ROCURONIUM BROMIDE 100 MG/10ML IV SOLN
INTRAVENOUS | Status: DC | PRN
  Administered 2015-09-09: 50 mg via INTRAVENOUS

## 2015-09-09 MED ORDER — OXYCODONE HCL 5 MG PO TABS
5.0000 mg | ORAL_TABLET | ORAL | Status: DC | PRN
  Administered 2015-09-09: 5 mg via ORAL

## 2015-09-09 MED ORDER — DEXAMETHASONE SODIUM PHOSPHATE 4 MG/ML IJ SOLN
INTRAMUSCULAR | Status: DC | PRN
  Administered 2015-09-09: 5 mg via INTRAVENOUS

## 2015-09-09 MED ORDER — NEOMYCIN-POLYMYXIN B GU 40-200000 IR SOLN
Status: DC | PRN
  Administered 2015-09-09: 2 mL

## 2015-09-09 MED ORDER — FENTANYL CITRATE (PF) 100 MCG/2ML IJ SOLN
INTRAMUSCULAR | Status: AC
  Filled 2015-09-09: qty 2

## 2015-09-09 MED ORDER — FENTANYL CITRATE (PF) 100 MCG/2ML IJ SOLN
INTRAMUSCULAR | Status: DC | PRN
  Administered 2015-09-09 (×2): 25 ug via INTRAVENOUS
  Administered 2015-09-09: 100 ug via INTRAVENOUS
  Administered 2015-09-09: 25 ug via INTRAVENOUS
  Administered 2015-09-09: 100 ug via INTRAVENOUS
  Administered 2015-09-09 (×3): 25 ug via INTRAVENOUS

## 2015-09-09 MED ORDER — PROMETHAZINE HCL 25 MG/ML IJ SOLN
6.2500 mg | Freq: Once | INTRAMUSCULAR | Status: AC
  Administered 2015-09-09: 6.25 mg via INTRAVENOUS

## 2015-09-09 MED ORDER — FENTANYL CITRATE (PF) 100 MCG/2ML IJ SOLN
25.0000 ug | INTRAMUSCULAR | Status: DC | PRN
  Administered 2015-09-09 (×4): 25 ug via INTRAVENOUS

## 2015-09-09 MED ORDER — FAMOTIDINE 20 MG PO TABS
20.0000 mg | ORAL_TABLET | Freq: Once | ORAL | Status: AC
  Administered 2015-09-09: 20 mg via ORAL

## 2015-09-09 MED ORDER — LIDOCAINE HCL (CARDIAC) 20 MG/ML IV SOLN
INTRAVENOUS | Status: DC | PRN
  Administered 2015-09-09: 100 mg via INTRAVENOUS

## 2015-09-09 MED ORDER — ONDANSETRON HCL 4 MG PO TABS: 4.0000 mg | ORAL_TABLET | Freq: Three times a day (TID) | ORAL | Status: DC | PRN

## 2015-09-09 MED ORDER — LIDOCAINE HCL 1 % IJ SOLN
INTRAMUSCULAR | Status: DC | PRN
  Administered 2015-09-09: 30 mL

## 2015-09-09 MED ORDER — PROMETHAZINE HCL 25 MG/ML IJ SOLN
INTRAMUSCULAR | Status: AC
  Administered 2015-09-09: 25 mg
  Filled 2015-09-09: qty 1

## 2015-09-09 MED ORDER — ACETAMINOPHEN 10 MG/ML IV SOLN
INTRAVENOUS | Status: DC | PRN
  Administered 2015-09-09: 1000 mg via INTRAVENOUS

## 2015-09-09 MED ORDER — MIDAZOLAM HCL 2 MG/2ML IJ SOLN
INTRAMUSCULAR | Status: DC | PRN
  Administered 2015-09-09: 3 mg via INTRAVENOUS

## 2015-09-09 MED ORDER — PROMETHAZINE HCL 6.25 MG/5ML PO SYRP
6.2500 mg | ORAL_SOLUTION | Freq: Once | ORAL | Status: DC
  Filled 2015-09-09: qty 5

## 2015-09-09 MED ORDER — ONDANSETRON HCL 4 MG/2ML IJ SOLN
4.0000 mg | Freq: Once | INTRAMUSCULAR | Status: AC | PRN
  Administered 2015-09-09: 4 mg via INTRAVENOUS

## 2015-09-09 MED ORDER — EPINEPHRINE HCL 1 MG/ML IJ SOLN
INTRAMUSCULAR | Status: AC
  Filled 2015-09-09: qty 1

## 2015-09-09 SURGICAL SUPPLY — 61 items
ADAPTER IRRIG TUBE 2 SPIKE SOL (ADAPTER) ×4 IMPLANT
ANCHOR SUT BIOC ST 3X145 (SUTURE) ×8 IMPLANT
BUR RADIUS 4.0X18.5 (BURR) ×2 IMPLANT
BUR RADIUS 5.5 (BURR) ×2 IMPLANT
CANNULA 5.75X7 CRYSTAL CLEAR (CANNULA) IMPLANT
CANNULA PARTIAL THREAD 2X7 (CANNULA) ×2 IMPLANT
CANNULA TWIST IN 8.25X9CM (CANNULA) IMPLANT
COOLER POLAR GLACIER W/PUMP (MISCELLANEOUS) ×2 IMPLANT
DRAPE IMP U-DRAPE 54X76 (DRAPES) ×4 IMPLANT
DRAPE INCISE IOBAN 66X45 STRL (DRAPES) ×2 IMPLANT
DRAPE U-SHAPE 47X51 STRL (DRAPES) ×2 IMPLANT
DURAPREP 26ML APPLICATOR (WOUND CARE) ×6 IMPLANT
GAUZE PETRO XEROFOAM 1X8 (MISCELLANEOUS) ×2 IMPLANT
GAUZE SPONGE 4X4 12PLY STRL (GAUZE/BANDAGES/DRESSINGS) ×4 IMPLANT
GLOVE BIOGEL PI IND STRL 9 (GLOVE) ×1 IMPLANT
GLOVE BIOGEL PI INDICATOR 9 (GLOVE) ×1
GLOVE SURG 9.0 ORTHO LTXF (GLOVE) ×4 IMPLANT
GOWN STRL REUS TWL 2XL XL LVL4 (GOWN DISPOSABLE) ×2 IMPLANT
GOWN STRL REUS W/ TWL LRG LVL3 (GOWN DISPOSABLE) ×1 IMPLANT
GOWN STRL REUS W/ TWL LRG LVL4 (GOWN DISPOSABLE) ×1 IMPLANT
GOWN STRL REUS W/TWL LRG LVL3 (GOWN DISPOSABLE) ×1
GOWN STRL REUS W/TWL LRG LVL4 (GOWN DISPOSABLE) ×1
IV LACTATED RINGER IRRG 3000ML (IV SOLUTION) ×27
IV LR IRRIG 3000ML ARTHROMATIC (IV SOLUTION) ×27 IMPLANT
KIT RM TURNOVER STRD PROC AR (KITS) ×2 IMPLANT
KIT STABILIZATION SHOULDER (MISCELLANEOUS) ×2 IMPLANT
KIT SUTURETAK 3.0 INSERT PERC (KITS) ×2 IMPLANT
LASSO SUT 25D RIGHT (SUTURE) ×2 IMPLANT
MANIFOLD NEPTUNE II (INSTRUMENTS) ×2 IMPLANT
MASK FACE SPIDER DISP (MASK) ×2 IMPLANT
MAT BLUE FLOOR 46X72 FLO (MISCELLANEOUS) ×2 IMPLANT
NDL SAFETY 18GX1.5 (NEEDLE) ×2 IMPLANT
NDL SAFETY 22GX1.5 (NEEDLE) ×2 IMPLANT
NS IRRIG 500ML POUR BTL (IV SOLUTION) ×2 IMPLANT
PACK ARTHROSCOPY SHOULDER (MISCELLANEOUS) ×2 IMPLANT
PAD GROUND ADULT SPLIT (MISCELLANEOUS) ×2 IMPLANT
PAD WRAPON POLAR SHDR UNIV (MISCELLANEOUS) ×1 IMPLANT
PASSER SUT CAPTURE FIRST (SUTURE) IMPLANT
PERCUTANEOUS INSERTION KIT FOR 3MM SUTURETAK ×2 IMPLANT
SET TUBE SUCT SHAVER OUTFL 24K (TUBING) ×2 IMPLANT
SET TUBE TIP INTRA-ARTICULAR (MISCELLANEOUS) ×2 IMPLANT
STRIP CLOSURE SKIN 1/2X4 (GAUZE/BANDAGES/DRESSINGS) ×2 IMPLANT
SUT ETHILON 4-0 (SUTURE) ×1
SUT ETHILON 4-0 FS2 18XMFL BLK (SUTURE) ×1
SUT LASSO 90 DEG SD STR (SUTURE) ×2 IMPLANT
SUT MNCRL 4-0 (SUTURE) ×1
SUT MNCRL 4-0 27XMFL (SUTURE) ×1
SUT PDS AB 0 CT1 27 (SUTURE) IMPLANT
SUT VIC AB 0 CT1 36 (SUTURE) IMPLANT
SUT VIC AB 2-0 CT2 27 (SUTURE) IMPLANT
SUTURE ETHLN 4-0 FS2 18XMF BLK (SUTURE) ×1 IMPLANT
SUTURE LASSO 25 DEG CVD LEFT (SUTURE) ×2 IMPLANT
SUTURE MAGNUM WIRE 2X48 BLK (SUTURE) IMPLANT
SUTURE MNCRL 4-0 27XMF (SUTURE) ×1 IMPLANT
SUTURE OPUS MAGNUM SZ 2 WHT (SUTURE) IMPLANT
SYRINGE 10CC LL (SYRINGE) ×2 IMPLANT
TAPE MICROFOAM 4IN (TAPE) ×2 IMPLANT
TUBING ARTHRO INFLOW-ONLY STRL (TUBING) ×2 IMPLANT
TUBING CONNECTING 10 (TUBING) ×2 IMPLANT
WAND HAND CNTRL MULTIVAC 90 (MISCELLANEOUS) ×2 IMPLANT
WRAPON POLAR PAD SHDR UNIV (MISCELLANEOUS) ×2

## 2015-09-09 NOTE — Anesthesia Preprocedure Evaluation (Signed)
Anesthesia Evaluation  Patient identified by MRN, date of birth, ID band Patient awake    Reviewed: Allergy & Precautions, NPO status , Patient's Chart, lab work & pertinent test results  Airway Mallampati: I       Dental no notable dental hx.    Pulmonary neg pulmonary ROS,    breath sounds clear to auscultation       Cardiovascular Exercise Tolerance: Good  Rhythm:Regular Rate:Normal     Neuro/Psych    GI/Hepatic Neg liver ROS, GERD  ,  Endo/Other  negative endocrine ROS  Renal/GU negative Renal ROS     Musculoskeletal negative musculoskeletal ROS (+)   Abdominal Normal abdominal exam  (+)   Peds  Hematology negative hematology ROS (+)   Anesthesia Other Findings   Reproductive/Obstetrics                             Anesthesia Physical Anesthesia Plan  ASA: I  Anesthesia Plan: General   Post-op Pain Management:    Induction: Intravenous  Airway Management Planned: Oral ETT  Additional Equipment:   Intra-op Plan:   Post-operative Plan: Extubation in OR  Informed Consent: I have reviewed the patients History and Physical, chart, labs and discussed the procedure including the risks, benefits and alternatives for the proposed anesthesia with the patient or authorized representative who has indicated his/her understanding and acceptance.     Plan Discussed with: CRNA  Anesthesia Plan Comments:         Anesthesia Quick Evaluation

## 2015-09-09 NOTE — Anesthesia Procedure Notes (Signed)
Procedure Name: Intubation Date/Time: 09/09/2015 7:46 AM Performed by: Shirlee LimerickMARION, Mason Maradiaga Pre-anesthesia Checklist: Patient identified, Emergency Drugs available, Suction available and Patient being monitored Patient Re-evaluated:Patient Re-evaluated prior to inductionOxygen Delivery Method: Circle system utilized Preoxygenation: Pre-oxygenation with 100% oxygen Intubation Type: IV induction Laryngoscope Size: Mac and 3 Grade View: Grade I Tube type: Oral Tube size: 7.0 mm Number of attempts: 1 Secured at: 23 cm Tube secured with: Tape Dental Injury: Teeth and Oropharynx as per pre-operative assessment

## 2015-09-09 NOTE — Discharge Instructions (Addendum)
Wear sling at all times, including sleep.  You will need to use the sling for a total of 4 weeks following surgery.  Do not try and lift your arm away from your body for any reason.   Keep the dressing dry.  You may remove bandage in 3 days.  Leave the Steri-Strips (white medical tape) in place.  You may place additional Band-Aids over top of the Steri-Strips if you wish.  May shower once dressing is removed in 3 days.  Remove sling carefully only for showers, leaving arm down by your side while in the shower.  If the the pain medication causes itching, or is too strong, try taking a single tablet at a time, or combining with Benadryl.  You may be most comfortable sleeping in a recliner.  If you do sleep in the bed, place pillows behind the shoulder that have the operation to support it.  AMBULATORY SURGERY  DISCHARGE INSTRUCTIONS   1) The drugs that you were given will stay in your system until tomorrow so for the next 24 hours you should not:  A) Drive an automobile B) Make any legal decisions C) Drink any alcoholic beverage   2) You may resume regular meals tomorrow.  Today it is better to start with liquids and gradually work up to solid foods.  You may eat anything you prefer, but it is better to start with liquids, then soup and crackers, and gradually work up to solid foods.   3) Please notify your doctor immediately if you have any unusual bleeding, trouble breathing, redness and pain at the surgery site, drainage, fever, or pain not relieved by medication.    4) Additional Instructions:        Please contact your physician with any problems or Same Day Surgery at [phone], Monday through Friday 6 am to 4 pm, or [place] [facility] Main number at [phone].

## 2015-09-09 NOTE — Anesthesia Postprocedure Evaluation (Signed)
Anesthesia Post Note  Patient: Mason Parker  Procedure(s) Performed: Procedure(s) (LRB): ARTHROSCOPY SHOULDER, RIGHT SHOULDER ATHROSCOPIC ANTERIOR AND POSTERIOR LABRAL TEAR REPAIR, CAPSULORRHAPHY (Right)  Patient location during evaluation: PACU Anesthesia Type: General Level of consciousness: awake Pain management: satisfactory to patient Vital Signs Assessment: post-procedure vital signs reviewed and stable Respiratory status: nonlabored ventilation Cardiovascular status: stable Anesthetic complications: no    Last Vitals:  Filed Vitals:   09/09/15 0618 09/09/15 1120  BP: 157/91 121/70  Pulse: 69 87  Temp: 36.9 C 36.7 C  Resp: 12 16    Last Pain: There were no vitals filed for this visit.               VAN STAVEREN,Keasia Dubose

## 2015-09-09 NOTE — Transfer of Care (Signed)
Immediate Anesthesia Transfer of Care Note  Patient: Mason Parker  Procedure(s) Performed: Procedure(s): ARTHROSCOPY SHOULDER, RIGHT SHOULDER ATHROSCOPIC ANTERIOR AND POSTERIOR LABRAL TEAR REPAIR, CAPSULORRHAPHY (Right)  Patient Location: PACU  Anesthesia Type:General  Level of Consciousness: sedated  Airway & Oxygen Therapy: Patient Spontanous Breathing and Patient connected to nasal cannula oxygen  Post-op Assessment: Report given to RN and Post -op Vital signs reviewed and stable  Post vital signs: Reviewed and stable  Last Vitals:  Filed Vitals:   09/09/15 0618 09/09/15 1120  BP: 157/91 121/70  Pulse: 69 87  Temp: 36.9 C   Resp: 12 16    Complications: No apparent anesthesia complications

## 2015-09-09 NOTE — Progress Notes (Signed)
Doing well  Polar care on  Tens unit on  Shoulder immobilizer on

## 2015-09-09 NOTE — H&P (Addendum)
PREOPERATIVE H&P  Chief Complaint: RECURRENT DISLOCATION RIGHT SHOULDER  HPI: Mason Parker is a 24 y.o. male who presents for preoperative history and physical with a diagnosis of RECURRENT DISLOCATION RIGHT SHOULDER. Symptoms are rated as moderate to severe, and have been worsening.  This is significantly impairing activities of daily living.  Patient no longer trusts his shoulder.  He has elected for surgical management. MRI of the right shoulder has revealed a posterior labral tear.  Past Medical History  Diagnosis Date  . Learning disability   . Personal history of dislocation of shoulder   . Viral wart   . Anxiety   . Allergy   . Insomnia   . Tic disorder   . Skin lesion   . Right hand paresthesia   . Obesity (BMI 30.0-34.9) 02/25/2015  . Headache     MIGRAINES  . Dysrhythmia     PALPITAIONS ASSOCIATED WITH ANXIETY ATTACKS ONLY  . GERD (gastroesophageal reflux disease)     NO MEDS-PT AVOIDS SPICY/ACIDIC FOODS AND NOW HAS NO PROBLEMS   Past Surgical History  Procedure Laterality Date  . No past surgeries    . Circumcision  1991   Social History   Social History  . Marital Status: Single    Spouse Name: N/A  . Number of Children: N/A  . Years of Education: N/A   Social History Main Topics  . Smoking status: Never Smoker   . Smokeless tobacco: Never Used  . Alcohol Use: No  . Drug Use: No  . Sexual Activity: Not Currently   Other Topics Concern  . None   Social History Narrative   Family History  Problem Relation Age of Onset  . Hypertension Mother   . Heart disease Mother   . Depression Mother   . Hypertension Father   . Bipolar disorder Father   . Hyperlipidemia Father   . Obesity Father   . Bipolar disorder Sister   . Depression Brother    Allergies  Allergen Reactions  . Other Swelling    YOGURT  . Shellfish Allergy Hives   Prior to Admission medications   Medication Sig Start Date End Date Taking? Authorizing Provider  ALPRAZolam  Prudy Feeler) 0.5 MG tablet Take 1 tablet (0.5 mg total) by mouth daily as needed for anxiety. 02/25/15  Yes Alba Cory, MD  PARoxetine (PAXIL) 40 MG tablet Take 1 tablet (40 mg total) by mouth at bedtime. 08/30/15  Yes Alba Cory, MD  ALPRAZolam (XANAX XR) 0.5 MG 24 hr tablet Take 1 tablet (0.5 mg total) by mouth daily. Patient not taking: Reported on 09/09/2015 08/30/15   Alba Cory, MD     Positive ROS: All other systems have been reviewed and were otherwise negative with the exception of those mentioned in the HPI and as above.  Physical Exam: General: Alert, no acute distress Cardiovascular: Regular rate and rhythm, no murmurs rubs or gallops.  No pedal edema Respiratory: Clear to auscultation bilaterally, no wheezes rales or rhonchi. No cyanosis, no use of accessory musculature GI: No organomegaly, abdomen is soft and non-tender nondistended with positive bowel sounds. Skin: Skin intact, no lesions within the operative field. Neurologic: Sensation intact distally Psychiatric: Patient is competent for consent with normal mood and affect Lymphatic: No axillary or cervical lymphadenopathy  MUSCULOSKELETAL: Right shoulder: Patient can voluntarily dislocate his shoulder posteriorly. He can spontaneously relocate it as well. Patient has full range of motion including 160 of forward elevation and abduction. He had a negative jerk test.  Patient  had pain with posteriorly directed force on his abducted shoulder.  He has had pain with posteriorly directed force on a flexed abducted and internally rotated shoulder. He has full digital wrist and elbow range of motion. He demonstrated no weakness of the rotator cuff. He has intact sensation light touch throughout the right upper extremity with a palpable radial pulse.  Assessment: RECURRENT DISLOCATION RIGHT SHOULDER  Plan: Plan for Procedure(s): RIGHT SHOULDER ARTHROSCOPIC POSTERIOR LABRAL REPAIR  I discussed the details of the operation  with the patient in my office prior to the date of surgery. His mother was present during this office visit. We discussed the operation in detail along with the postoperative recovery. He understands the importance of wearing his sling postoperatively for 4 weeks. I reviewed all the patient's preoperative labs and studies in preparation for this case.  I also discussed the risks and benefits of surgery with him. He understood the risks include but are not limited to infection, bleeding requiring blood transfusion, nerve or blood vessel injury, joint stiffness or loss of motion, persistent pain, weakness or instability and hardware failure and the need for further surgery. Medical risks include but are not limited to DVT and pulmonary embolism, myocardial infarction, stroke, pneumonia, respiratory failure and death. Patient understood these risks and wished to proceed. I answered all questions by the patient. He is in agreement with the plan for surgery.  Mason Parker, Mason Pickel, MD   09/09/2015 7:30 AM

## 2015-09-10 ENCOUNTER — Encounter: Payer: Self-pay | Admitting: Orthopedic Surgery

## 2015-09-11 NOTE — Op Note (Signed)
09/09/2015  10:10 AM  PATIENT:  Mason Parker    PRE-OPERATIVE DIAGNOSIS:  RECURRENT DISLOCATION RIGHT SHOULDER  POST-OPERATIVE DIAGNOSIS:  Same  PROCEDURE:  ARTHROSCOPY SHOULDER, RIGHT SHOULDER ARTHROSCOPIC ANTERIOR AND POSTERIOR CAPSULORRHAPHY AND POSTERIOR LABRAL REPAIR  SURGEON:  Thornton Park, MD  ANESTHESIA:   General  PREOPERATIVE INDICATIONS:  Mason Parker is a  24 y.o. male with a diagnosis of RECURRENT DISLOCATION RIGHT SHOULDER who failed conservative measures and elected for surgical management.    I discussed the risks and benefits of surgery. The risks include but are not limited to infection, bleeding, nerve or blood vessel injury, joint stiffness or loss of motion, persistent pain, weakness or instability, and hardware failure and the need for further surgery. Medical risks include but are not limited to DVT and pulmonary embolism, myocardial infarction, stroke, pneumonia, respiratory failure and death. Patient understood these risks and wished to proceed.    OPERATIVE IMPLANTS: Arthrex bio suturetak anchors 4   OPERATIVE FINDINGS:  right shoulder posterior labral tear with multidirectional instability  OPERATIVE PROCEDURE:  I met with the patient and his family in the preoperative area.  I signed the  right shoulder according the hospital's correct site of surgery protocol.  I answered all questions by the family. Patient was brought to the operating room. He underwent general endotracheal intubation. He was then positioned in a beachchair position. All bony prominences were adequately padded including the lower extremities.  Examination under anesthesia revealed multidirectional instability, including subluxation and the anterior posterior and inferior directions with load and shift testing sulcus sign testing respectively.  The patient was then prepped and draped in a sterile fashion. He received 2 g of Ancef prior to the onset of the case.  A timeout was  performed to verify the patient's name, date of birth, medical record number, correct site of surgery and correct procedure to be performed. Was also used to verify the patient received antibiotics that all appropriate instruments, implants and radiographic studies were available in the room. Once all in attendance were in agreement case began.  Bony landmarks were drawn out with a surgical marker along with proposed incisions. These were pre-injected with 1% lidocaine plain. An 11 blade was used to establish a posterior portal through which the arthroscope was placed in the glenohumeral joint. An anterior portal was established under direct visualization using an 18-gauge spinal needle for localization. A 5.75 mm arthroscopic cannula was inserted through the anterior portal. A full diagnostic examination of the glenohumeral joint was performed.  Findings on arthroscopy included no evidence of tear of the biceps, superior labrum or rotator cuff. The subscapularis was intact. Patient had a patulous capsule with a large inferior recess. Patient had a tear of the posterior labrum. The anterior labrum was diminutive but not torn. Under direct visualization the patient's shoulder could be subluxated anteriorly posteriorly and inferiorly. Patient had no focal chondral defects.  Given the patient's multidirectional instability, the decision was made to perform a capsulorrhaphy. An anterolateral portal was established again under direct visualization using an 18-gauge spinal needle. A 7 mm cannula was placed through this anterolateral portal. A single Arthrex bio suturetakk anchor was then placed at the 5:00 position. The anterior inferior capsule was then abraded with a rasp. A 90 Arthrex suture lasso was then used to shuttle a single limb of this anchor through the anterior inferior capsule.  Arthroscopic knot tying technique was then used to complete the capsulorrhaphy anteriorly.  Switching sticks were then  used to  switch the position of the arthroscopic probe through the anterior portal. Posterior labrum was identified. The tear extended from approximately the 7:00 to the 10:00 position. Posterior labrum was also diminutive. The tear involved significant fraying of the posterior labrum without significant detachment.  A second posterolateral portal was established again using an 18-gauge spinal needle for localization.  Three Arthrex Bio Suturetak anchors were then placed in the posterior rim of the glenoid. One at the 7:00 position one at the 8:30 position and one at the 10:00 position again a 90 Arthrex suture lasso was used to shuttle a single limb of each anchor through the posterior labrum and posterior inferior capsule.  Arthroscopic knot tying technique was used to complete the posterior capsulorrhaphy. Final arthroscopic images were taken. Under direct visualization the patient was no longer subluxating in the anterior posterior or inferior directions.  The glenohumeral joint was then copiously irrigated.  All arthroscopic instruments were removed. The 4 arthroscopic portals were closed with 4-0 nylon. A dry, sterile dressing was applied to the right shoulder, along with TENS unit leads and a Polar Care sleeve. Patient's right arm was then placed in an abduction sling. He was awoken and brought to PACU in stable condition. I was scrubbed and present for the entire case and all sharp and instrument counts were correct at the conclusion the case. I spoke with the patient's father in the postop consultation room to let him know the operation was successful and performed without complication.    Mason Gaul, MD

## 2015-09-23 ENCOUNTER — Encounter: Payer: Self-pay | Admitting: Orthopedic Surgery

## 2015-11-29 ENCOUNTER — Encounter: Payer: Self-pay | Admitting: Family Medicine

## 2015-11-29 ENCOUNTER — Ambulatory Visit (INDEPENDENT_AMBULATORY_CARE_PROVIDER_SITE_OTHER): Payer: 59 | Admitting: Family Medicine

## 2015-11-29 VITALS — BP 118/78 | HR 84 | Temp 98.7°F | Resp 16 | Wt 232.5 lb

## 2015-11-29 DIAGNOSIS — Z96611 Presence of right artificial shoulder joint: Secondary | ICD-10-CM

## 2015-11-29 DIAGNOSIS — J309 Allergic rhinitis, unspecified: Secondary | ICD-10-CM

## 2015-11-29 DIAGNOSIS — F339 Major depressive disorder, recurrent, unspecified: Secondary | ICD-10-CM | POA: Diagnosis not present

## 2015-11-29 DIAGNOSIS — J3089 Other allergic rhinitis: Secondary | ICD-10-CM

## 2015-11-29 DIAGNOSIS — Z471 Aftercare following joint replacement surgery: Secondary | ICD-10-CM | POA: Diagnosis not present

## 2015-11-29 DIAGNOSIS — E66811 Obesity, class 1: Secondary | ICD-10-CM

## 2015-11-29 DIAGNOSIS — F819 Developmental disorder of scholastic skills, unspecified: Secondary | ICD-10-CM | POA: Diagnosis not present

## 2015-11-29 DIAGNOSIS — F411 Generalized anxiety disorder: Secondary | ICD-10-CM | POA: Diagnosis not present

## 2015-11-29 DIAGNOSIS — K219 Gastro-esophageal reflux disease without esophagitis: Secondary | ICD-10-CM

## 2015-11-29 DIAGNOSIS — E669 Obesity, unspecified: Secondary | ICD-10-CM | POA: Diagnosis not present

## 2015-11-29 DIAGNOSIS — J302 Other seasonal allergic rhinitis: Secondary | ICD-10-CM

## 2015-11-29 MED ORDER — ALPRAZOLAM ER 0.5 MG PO TB24: 0.5000 mg | ORAL_TABLET | Freq: Every day | ORAL | Status: DC

## 2015-11-29 MED ORDER — PAROXETINE HCL 40 MG PO TABS: 40.0000 mg | ORAL_TABLET | Freq: Every day | ORAL | Status: DC

## 2015-11-29 MED ORDER — ALPRAZOLAM 0.5 MG PO TABS: 0.5000 mg | ORAL_TABLET | Freq: Every day | ORAL | Status: DC | PRN

## 2015-11-29 NOTE — Progress Notes (Signed)
Name: Mason Parker   MRN: [id number]    DOB: November 08, 1990   Date:11/29/2015       Progress Note  Subjective  Chief Complaint  Chief Complaint  Patient presents with  . Hypertension  . Depression  . Anxiety  . Shoulder Injury    patient recently had surgery on his right shoulder for a tear. patient is currently in physical therapy    HPI  Right shoulder surgery status: he injured the right shoulder 10 years ago, he has arthroscopic surgery done by Dr. Martha Clan in Dec 2016 and is still having PT, doing very well on it, good rom, off pain medication, currently taking Meloxicam  GERD: controlled with life style modification, has a flare when he eats spicy food/ or citric food. Taking Zantac or Tums prn , at most twice weekly. He just started taking Meloxicam, but still not having any flares.   Obesity: hist weight is stable since last visit. He states he is not following a healthy diet lately, he is baking sweets.   Major Depression and GAD: still seeing therapist Not currently dating - [name] still contacts him but she told him that she loves one of her ex-boyfriend. He has been taking Paxil, but not the Xanax, but would like to resume medication since it makes him feel calm inside.   Perennial AR: currently doing well, no rhinorrhea or nasal congestion  Learning Disability: he is not going to [facility] this Semester because of recent surgery, he will go back in the Fall.   Patient Active Problem List   Diagnosis Date Noted  . 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 Recurrent dislocation of right shoulder 07/16/2015  . Verruca warts (infectious) 06/03/2015  . Anxiety, generalized 02/25/2015  . Insomnia 02/25/2015  . Gastro-esophageal reflux disease without esophagitis 02/25/2015  . Chronic recurrent major depressive disorder (HCC) 02/25/2015  . Obesity (BMI 30.0-34.9) 02/25/2015  . 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 Perennial allergic rhinitis with  seasonal variation 02/25/2015  . Learning disability 02/25/2015    Past Surgical History  Procedure Laterality Date  . No past surgeries    . Circumcision  1991  . Shoulder arthroscopy Right 09/09/2015    Procedure: ARTHROSCOPY SHOULDER, RIGHT SHOULDER ATHROSCOPIC ANTERIOR AND POSTERIOR LABRAL TEAR REPAIR, CAPSULORRHAPHY;  Surgeon: Juanell Fairly, MD;  Location: [facility] ORS;  Service: Orthopedics;  Laterality: Right;    Family History  Problem Relation Age of Onset  . Hypertension Mother   . Heart disease Mother   . Depression Mother   . Hypertension Father   . Bipolar disorder Father   . Hyperlipidemia Father   . Obesity Father   . Bipolar disorder Sister   . Depression Brother     Social History   Social History  . Marital Status: Single    Spouse Name: N/A  . Number of Children: N/A  . Years of Education: N/A   Occupational History  . Not on file.   Social History Main Topics  . Smoking status: Never Smoker   . Smokeless tobacco: Never Used  . Alcohol Use: No  . Drug Use: No  . Sexual Activity: Not Currently   Other Topics Concern  . Not on file   Social History Narrative     Current outpatient prescriptions:  .  ALPRAZolam (XANAX XR) 0.5 MG 24 hr tablet, Take 1 tablet (0.5 mg total) by mouth daily., Disp: 30 tablet, Rfl: 2 .  ALPRAZolam (XANAX) 0.5 MG tablet, Take 1 tablet (0.5  mg total) by mouth daily as needed for anxiety. Must last 90 days, Disp: 30 tablet, Rfl: 0 .  meloxicam (MOBIC) 7.5 MG tablet, TK 1 T PO BID WC, Disp: , Rfl: 1 .  PARoxetine (PAXIL) 40 MG tablet, Take 1 tablet (40 mg total) by mouth at bedtime., Disp: 30 tablet, Rfl: 2  Allergies  Allergen Reactions  . Other Swelling    YOGURT  . Shellfish Allergy Hives     ROS  Constitutional: Negative for fever or weight change.  Respiratory: Negative for cough and shortness of breath.   Cardiovascular: Negative for chest pain or palpitations.  Gastrointestinal: Negative for abdominal  pain, no bowel changes.  Musculoskeletal: Negative for gait problem or joint swelling.  Skin: Negative for rash.  Neurological: Negative for dizziness or headache.  No other specific complaints in a complete review of systems (except as listed in HPI above).  Objective  Filed Vitals:   11/29/15 1023  BP: 118/78  Pulse: 84  Temp: 98.7 F (37.1 C)  TempSrc: Oral  Resp: 16  Weight: 232 lb 8 oz (105.461 kg)  SpO2: 98%    Body mass index is 31.53 kg/(m^2).  Physical Exam  Constitutional: Patient appears well-developed and well-nourished. Obese  No distress.  HEENT: head atraumatic, normocephalic, pupils equal and reactive to light,neck supple, throat within normal limits Cardiovascular: Normal rate, regular rhythm and normal heart sounds.  No murmur heard. No BLE edema. Pulmonary/Chest: Effort normal and breath sounds normal. No respiratory distress. Abdominal: Soft.  There is no tenderness. Muscular Skeletal: good rom of right shoulder Psychiatric: Patient has a normal mood and affect. behavior is normal. Judgment and thought content normal.  PHQ2/9: Depression screen [facility] 2/9 11/29/2015 05/05/2015 02/25/2015  Decreased Interest 0 0 1  Down, Depressed, Hopeless 0 0 2  PHQ - 2 Score 0 0 3  Altered sleeping - - 3  Tired, decreased energy - - 3  Change in appetite - - 1  Feeling bad or failure about yourself  - - 1  Trouble concentrating - - 0  Moving slowly or fidgety/restless - - 1  Suicidal thoughts - - 0  PHQ-9 Score - - 12  Difficult doing work/chores - - Somewhat difficult     Fall Risk: Fall Risk  11/29/2015 05/05/2015 02/25/2015  Falls in the past year? No No No     Functional Status Survey: Is the patient deaf or have difficulty hearing?: No Does the patient have difficulty seeing, even when wearing glasses/contacts?: No Does the patient have difficulty concentrating, remembering, or making decisions?: No Does the patient have difficulty walking or climbing  stairs?: No Does the patient have difficulty dressing or bathing?: No Does the patient have difficulty doing errands alone such as visiting a doctor's office or shopping?: No    Assessment & Plan  1. Anxiety, generalized  Still not driving because of anxiety, father drops him off at school and brings him to office visit, he is not on disability.  - PARoxetine (PAXIL) 40 MG tablet; Take 1 tablet (40 mg total) by mouth at bedtime.  Dispense: 30 tablet; Refill: 2 - ALPRAZolam (XANAX XR) 0.5 MG 24 hr tablet; Take 1 tablet (0.5 mg total) by mouth daily.  Dispense: 30 tablet; Refill: 2 - ALPRAZolam (XANAX) 0.5 MG tablet; Take 1 tablet (0.5 mg total) by mouth daily as needed for anxiety. Must last 90 days  Dispense: 30 tablet; Refill: 0  2. Chronic recurrent major depressive disorder (HCC)  - PARoxetine (PAXIL)  40 MG tablet; Take 1 tablet (40 mg total) by mouth at bedtime.  Dispense: 30 tablet; Refill: 2  3. Obesity (BMI 30.0-34.9)  Discussed with the patient the risk posed by an increased BMI. Discussed importance of portion control, calorie counting and at least 150 minutes of physical activity weekly. Avoid sweet beverages and drink more water. Eat at least 6 servings of fruit and vegetables daily   4. Learning disability  [facility] Fall  5. Perennial allergic rhinitis with seasonal variation  Doing well  6. Gastro-esophageal reflux disease without esophagitis  Not on medication, discussed risk of nsaid's  7. Aftercare following right shoulder joint replacement surgery  Continue PT

## 2016-02-29 ENCOUNTER — Ambulatory Visit: Payer: 59 | Admitting: Family Medicine

## 2016-04-12 ENCOUNTER — Ambulatory Visit (INDEPENDENT_AMBULATORY_CARE_PROVIDER_SITE_OTHER): Payer: 59 | Admitting: Family Medicine

## 2016-04-12 ENCOUNTER — Encounter: Payer: Self-pay | Admitting: Family Medicine

## 2016-04-12 VITALS — BP 138/108 | HR 97 | Temp 98.2°F | Resp 18 | Ht 72.0 in | Wt 244.0 lb

## 2016-04-12 DIAGNOSIS — Z96611 Presence of right artificial shoulder joint: Secondary | ICD-10-CM | POA: Diagnosis not present

## 2016-04-12 DIAGNOSIS — IMO0001 Reserved for inherently not codable concepts without codable children: Secondary | ICD-10-CM

## 2016-04-12 DIAGNOSIS — F411 Generalized anxiety disorder: Secondary | ICD-10-CM

## 2016-04-12 DIAGNOSIS — E669 Obesity, unspecified: Secondary | ICD-10-CM | POA: Diagnosis not present

## 2016-04-12 DIAGNOSIS — R030 Elevated blood-pressure reading, without diagnosis of hypertension: Secondary | ICD-10-CM

## 2016-04-12 DIAGNOSIS — K219 Gastro-esophageal reflux disease without esophagitis: Secondary | ICD-10-CM

## 2016-04-12 DIAGNOSIS — F339 Major depressive disorder, recurrent, unspecified: Secondary | ICD-10-CM

## 2016-04-12 DIAGNOSIS — Z471 Aftercare following joint replacement surgery: Secondary | ICD-10-CM | POA: Diagnosis not present

## 2016-04-12 MED ORDER — ALPRAZOLAM ER 0.5 MG PO TB24: 0.5000 mg | ORAL_TABLET | Freq: Every day | ORAL | 2 refills | Status: DC

## 2016-04-12 MED ORDER — PAROXETINE HCL 40 MG PO TABS: 40.0000 mg | ORAL_TABLET | Freq: Every day | ORAL | 1 refills | Status: DC

## 2016-04-12 NOTE — Progress Notes (Signed)
Name: Mason Parker   MRN: [id number]    DOB: 09-May-1991   Date:04/12/2016       Progress Note  Subjective  Chief Complaint  Chief Complaint  Patient presents with  . Medication Refill    4 month F/U  . Anxiety    Patient states he has been without medication for the past 2 weeks   . Depression    HPI  Right shoulder surgery status: he injured the right shoulder 10 years ago, he has arthroscopic surgery done by Dr. Martha Clan in Dec 2016 and is still having PT, doing very well on it, good rom, great improvement. He states he hurt his right shoulder when his dog got hit by a car 3 weeks ago and he picked the dog up, but pain has now resolved.   GERD:,he has not been following a healthy diet lately and symptoms are recurring, he will try to resume diet   Obesity: hist weight has gone up since last visit.  He states he is not following a healthy diet lately, he is baking sweets and is eating out.    Major Depression and GAD: . He was doing well on Paxil but he ran out about one month ago because he missed an appointment. He has also been worried about their dog Leta Jungling that was hit by a car. He would like to resume medication   Learning Disability: he is not going to [facility] this Semester because of recent surgery, he will go back in the Fall.    Patient Active Problem List   Diagnosis Date Noted  . 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 Recurrent dislocation of right shoulder 07/16/2015  . Verruca warts (infectious) 06/03/2015  . Anxiety, generalized 02/25/2015  . Insomnia 02/25/2015  . Gastro-esophageal reflux disease without esophagitis 02/25/2015  . Chronic recurrent major depressive disorder (HCC) 02/25/2015  . Obesity (BMI 30.0-34.9) 02/25/2015  . 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 Perennial allergic rhinitis with seasonal variation 02/25/2015  . Learning disability 02/25/2015    Past Surgical History:  Procedure Laterality Date  .  CIRCUMCISION  1991  . NO PAST SURGERIES    . SHOULDER ARTHROSCOPY Right 09/09/2015   Procedure: ARTHROSCOPY SHOULDER, RIGHT SHOULDER ATHROSCOPIC ANTERIOR AND POSTERIOR LABRAL TEAR REPAIR, CAPSULORRHAPHY;  Surgeon: Juanell Fairly, MD;  Location: [facility] ORS;  Service: Orthopedics;  Laterality: Right;    Family History  Problem Relation Age of Onset  . Hypertension Mother   . Heart disease Mother   . Depression Mother   . Hypertension Father   . Bipolar disorder Father   . Hyperlipidemia Father   . Obesity Father   . Bipolar disorder Sister   . Depression Brother     Social History   Social History  . Marital status: Single    Spouse name: N/A  . Number of children: N/A  . Years of education: N/A   Occupational History  . Not on file.   Social History Main Topics  . Smoking status: Never Smoker  . Smokeless tobacco: Never Used  . Alcohol use No  . Drug use: No  . Sexual activity: Not Currently   Other Topics Concern  . Not on file   Social History Narrative  . No narrative on file     Current Outpatient Prescriptions:  .  ALPRAZolam (XANAX XR) 0.5 MG 24 hr tablet, Take 1 tablet (0.5 mg total) by mouth daily., Disp: 30 tablet, Rfl: 2 .  ALPRAZolam (XANAX) 0.5 MG tablet,  Take 1 tablet (0.5 mg total) by mouth daily as needed for anxiety. Must last 90 days, Disp: 30 tablet, Rfl: 0 .  PARoxetine (PAXIL) 40 MG tablet, Take 1 tablet (40 mg total) by mouth at bedtime., Disp: 90 tablet, Rfl: 1  Allergies  Allergen Reactions  . Other Swelling    YOGURT  . Shellfish Allergy Hives     ROS  Constitutional: Negative for fever, positive for weight change.  Respiratory: Negative for cough and shortness of breath.   Cardiovascular: Negative for chest pain or palpitations.  Gastrointestinal: Negative for abdominal pain, no bowel changes.  Musculoskeletal: Negative for gait problem or joint swelling.  Skin: Negative for rash.  Neurological: Negative for dizziness or  headache.  No other specific complaints in a complete review of systems (except as listed in HPI above).  Objective  Vitals:   04/12/16 1526  BP: (!) 138/108  Pulse: 97  Resp: 18  Temp: 98.2 F (36.8 C)  TempSrc: Oral  SpO2: 97%  Weight: 244 lb (110.7 kg)  Height: 6' (1.829 m)    Body mass index is 33.09 kg/m.  Physical Exam  Constitutional: Patient appears well-developed and well-nourished. Obese No distress.  HEENT: head atraumatic, normocephalic, pupils equal and reactive to light,  neck supple, throat within normal limits Cardiovascular: Normal rate, regular rhythm and normal heart sounds.  No murmur heard. No BLE edema. Pulmonary/Chest: Effort normal and breath sounds normal. No respiratory distress. Abdominal: Soft.  There is no tenderness. Psychiatric: Patient has a normal mood and affect. behavior is normal. Judgment and thought content normal.  PHQ2/9: Depression screen [facility] 2/9 04/12/2016 11/29/2015 05/05/2015 02/25/2015  Decreased Interest 1 0 0 1  Down, Depressed, Hopeless 1 0 0 2  PHQ - 2 Score 2 0 0 3  Altered sleeping 3 - - 3  Tired, decreased energy 2 - - 3  Change in appetite 1 - - 1  Feeling bad or failure about yourself  1 - - 1  Trouble concentrating 0 - - 0  Moving slowly or fidgety/restless 0 - - 1  Suicidal thoughts 0 - - 0  PHQ-9 Score 9 - - 12  Difficult doing work/chores Somewhat difficult - - Somewhat difficult     Fall Risk: Fall Risk  04/12/2016 11/29/2015 05/05/2015 02/25/2015  Falls in the past year? No No No No     Functional Status Survey: Is the patient deaf or have difficulty hearing?: No Does the patient have difficulty seeing, even when wearing glasses/contacts?: No Does the patient have difficulty concentrating, remembering, or making decisions?: No Does the patient have difficulty walking or climbing stairs?: No Does the patient have difficulty dressing or bathing?: No Does the patient have difficulty doing errands alone such as  visiting a doctor's office or shopping?: No    Assessment & Plan  1. Anxiety, generalized  He is has been out Xanax XR and Paxil and has been feeling anxious again, missed an appointment while helping his granparents - ALPRAZolam (XANAX XR) 0.5 MG 24 hr tablet; Take 1 tablet (0.5 mg total) by mouth daily.  Dispense: 30 tablet; Refill: 2 - PARoxetine (PAXIL) 40 MG tablet; Take 1 tablet (40 mg total) by mouth at bedtime.  Dispense: 90 tablet; Refill: 1  2. Obesity (BMI 30.0-34.9)  He gained weight, states he has been stressed eating, he states he has been physically active  3. Chronic recurrent major depressive disorder (HCC)  - PARoxetine (PAXIL) 40 MG tablet; Take 1 tablet (40 mg  total) by mouth at bedtime.  Dispense: 90 tablet; Refill: 1  4. Elevated blood pressure  He has been out of anxiety medication and states his bp goes up    5. Aftercare following right shoulder joint replacement surgery  Doing well   6. Gastro-esophageal reflux disease without esophagitis  He needs to resume life style modification

## 2016-04-21 ENCOUNTER — Telehealth: Payer: Self-pay

## 2016-04-21 NOTE — Telephone Encounter (Signed)
Pt call and stated he miss place his scripts for depression and anxiety before filling them and needs to get another hard copy. Call father Greggory Stallion once finished, Thanks

## 2016-04-23 NOTE — Telephone Encounter (Signed)
Please call in Paxil, I can't call in alprazolam XR, ask him to search his house and car and call us back.

## 2016-04-24 NOTE — Telephone Encounter (Signed)
Call his father Greggory StallionGeorge to see where they wanted his Paxil to be called into due to when calling pharmacy on file they stated he has used them since March 2017. Also related the message to Loistine Chancehilip about looking for the Alprazolam prescription since he was unavailable.

## 2016-07-11 ENCOUNTER — Ambulatory Visit (INDEPENDENT_AMBULATORY_CARE_PROVIDER_SITE_OTHER): Payer: 59 | Admitting: Family Medicine

## 2016-07-11 ENCOUNTER — Encounter: Payer: Self-pay | Admitting: Family Medicine

## 2016-07-11 VITALS — BP 124/84 | HR 93 | Temp 98.2°F | Resp 18 | Ht 72.0 in | Wt 236.1 lb

## 2016-07-11 DIAGNOSIS — Z23 Encounter for immunization: Secondary | ICD-10-CM | POA: Diagnosis not present

## 2016-07-11 DIAGNOSIS — E669 Obesity, unspecified: Secondary | ICD-10-CM | POA: Diagnosis not present

## 2016-07-11 DIAGNOSIS — R0683 Snoring: Secondary | ICD-10-CM | POA: Diagnosis not present

## 2016-07-11 DIAGNOSIS — F411 Generalized anxiety disorder: Secondary | ICD-10-CM

## 2016-07-11 DIAGNOSIS — F339 Major depressive disorder, recurrent, unspecified: Secondary | ICD-10-CM

## 2016-07-11 MED ORDER — ALPRAZOLAM 0.5 MG PO TABS: 0.5000 mg | ORAL_TABLET | Freq: Every day | ORAL | 0 refills | Status: DC | PRN

## 2016-07-11 MED ORDER — ALPRAZOLAM ER 0.5 MG PO TB24: 0.5000 mg | ORAL_TABLET | Freq: Every day | ORAL | 2 refills | Status: DC

## 2016-07-11 NOTE — Progress Notes (Signed)
Name: Mason Parker   MRN: [id number]    DOB: [birth year]   Date:07/11/2016       Progress Note  Subjective  Chief Complaint  Chief Complaint  Patient presents with  . Anxiety    3 month follow up  . Snoring    pt girlfriend has told him that he snores when they sleep  . Flu Vaccine    HPI  Obesity: he lost 8 lbs since last visit. He is dating and his girlfriend helps him not to snack all the time or drink sodas.    Snoring: loud and girlfriend in concerned, ESS in our office is 3. He has pauses of breath during his sleep, we will try scheduling a sleep study  Major Depression and GAD: . He is doing well on Paxil 40 mg, also feeling well since he started to dating again ( back with his first girlfriend - from [employer], she is an alcoholic - drinking 16 ounces of liquor daily. He has been taking Alprazolam XR daily but states when having a lot of exams he takes prn alprazolam ,last rx was written in March 2017.   Learning Disability: he is back at [facility], getting A's, B's.    Patient Active Problem List   Diagnosis Date Noted  . 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 Recurrent dislocation of right shoulder 07/16/2015  . Verruca warts (infectious) 06/03/2015  . Anxiety, generalized 02/25/2015  . Insomnia 02/25/2015  . Gastro-esophageal reflux disease without esophagitis 02/25/2015  . Chronic recurrent major depressive disorder (HCC) 02/25/2015  . Obesity (BMI 30.0-34.9) 02/25/2015  . 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 Perennial allergic rhinitis with seasonal variation 02/25/2015  . Learning disability 02/25/2015    Past Surgical History:  Procedure Laterality Date  . CIRCUMCISION  1991  . NO PAST SURGERIES    . SHOULDER ARTHROSCOPY Right 09/09/2015   Procedure: ARTHROSCOPY SHOULDER, RIGHT SHOULDER ATHROSCOPIC ANTERIOR AND POSTERIOR LABRAL TEAR REPAIR, CAPSULORRHAPHY;  Surgeon: Juanell FairlyKevin Krasinski, MD;  Location: [facility] ORS;  Service:  Orthopedics;  Laterality: Right;    Family History  Problem Relation Age of Onset  . Hypertension Mother   . Heart disease Mother   . Depression Mother   . Hypertension Father   . Bipolar disorder Father   . Hyperlipidemia Father   . Obesity Father   . Bipolar disorder Sister   . Depression Brother     Social History   Social History  . Marital status: Single    Spouse name: N/A  . Number of children: N/A  . Years of education: N/A   Occupational History  . Not on file.   Social History Main Topics  . Smoking status: Never Smoker  . Smokeless tobacco: Never Used  . Alcohol use No  . Drug use: No  . Sexual activity: Not Currently   Other Topics Concern  . Not on file   Social History Narrative  . No narrative on file     Current Outpatient Prescriptions:  .  ALPRAZolam (XANAX XR) 0.5 MG 24 hr tablet, Take 1 tablet (0.5 mg total) by mouth daily., Disp: 30 tablet, Rfl: 2 .  ALPRAZolam (XANAX) 0.5 MG tablet, Take 1 tablet (0.5 mg total) by mouth daily as needed for anxiety. Must last 90 days, Disp: 30 tablet, Rfl: 0 .  PARoxetine (PAXIL) 40 MG tablet, Take 1 tablet (40 mg total) by mouth at bedtime., Disp: 90 tablet, Rfl: 1  Allergies  Allergen Reactions  . Other  Swelling    YOGURT  . Shellfish Allergy Hives     ROS  Ten systems reviewed and is negative except as mentioned in HPI   Objective  Vitals:   07/11/16 1437  BP: 124/84  Pulse: 93  Resp: 18  Temp: 98.2 F (36.8 C)  TempSrc: Oral  SpO2: 97%  Weight: 236 lb 1 oz (107.1 kg)  Height: 6' (1.829 m)    Body mass index is 32.02 kg/m.  Physical Exam  Constitutional: Patient appears well-developed and well-nourished. Obese No distress.  HEENT: head atraumatic, normocephalic, pupils equal and reactive to light, neck supple, throat within normal limits Cardiovascular: Normal rate, regular rhythm and normal heart sounds.  No murmur heard. No BLE edema. Pulmonary/Chest: Effort normal and breath  sounds normal. No respiratory distress. Abdominal: Soft.  There is no tenderness. Psychiatric: Patient has a normal mood and affect. behavior is normal. Judgment and thought content normal.  PHQ2/9: Depression screen [facility] 2/9 07/11/2016 04/12/2016 11/29/2015 05/05/2015 02/25/2015  Decreased Interest 2 1 0 0 1  Down, Depressed, Hopeless 2 1 0 0 2  PHQ - 2 Score 4 2 0 0 3  Altered sleeping 0 3 - - 3  Tired, decreased energy 0 2 - - 3  Change in appetite 1 1 - - 1  Feeling bad or failure about yourself  0 1 - - 1  Trouble concentrating 0 0 - - 0  Moving slowly or fidgety/restless 0 0 - - 1  Suicidal thoughts 0 0 - - 0  PHQ-9 Score 5 9 - - 12  Difficult doing work/chores Not difficult at all Somewhat difficult - - Somewhat difficult     Fall Risk: Fall Risk  07/11/2016 04/12/2016 11/29/2015 05/05/2015 02/25/2015  Falls in the past year? No No No No No     Assessment & Plan  1. Anxiety, generalized  - ALPRAZolam (XANAX XR) 0.5 MG 24 hr tablet; Take 1 tablet (0.5 mg total) by mouth daily.  Dispense: 30 tablet; Refill: 2 - ALPRAZolam (XANAX) 0.5 MG tablet; Take 1 tablet (0.5 mg total) by mouth daily as needed for anxiety. Must last 90 days  Dispense: 30 tablet; Refill: 0  2. Chronic recurrent major depressive disorder (HCC)  Doing well on Paxil , discussed happiness from inside not from outside factors, like having a girlfriend  3. Obesity (BMI 30.0-34.9)  Discussed with the patient the risk posed by an increased BMI. Discussed importance of portion control, calorie counting and at least 150 minutes of physical activity weekly. Avoid sweet beverages and drink more water. Eat at least 6 servings of fruit and vegetables daily   4. Needs flu shot  - Flu Vaccine QUAD 36+ mos IM  5. Snoring  - Ambulatory referral to Sleep Studies

## 2016-07-12 ENCOUNTER — Encounter: Payer: Self-pay | Admitting: Family Medicine

## 2016-07-12 ENCOUNTER — Ambulatory Visit: Payer: 59 | Admitting: Family Medicine

## 2016-10-17 ENCOUNTER — Ambulatory Visit: Payer: 59 | Admitting: Family Medicine

## 2016-10-27 ENCOUNTER — Encounter: Payer: Self-pay | Admitting: Family Medicine

## 2016-10-27 ENCOUNTER — Ambulatory Visit (INDEPENDENT_AMBULATORY_CARE_PROVIDER_SITE_OTHER): Payer: 59 | Admitting: Family Medicine

## 2016-10-27 VITALS — BP 138/82 | HR 97 | Temp 98.0°F | Resp 16 | Ht 72.0 in | Wt 236.4 lb

## 2016-10-27 DIAGNOSIS — E669 Obesity, unspecified: Secondary | ICD-10-CM

## 2016-10-27 DIAGNOSIS — F411 Generalized anxiety disorder: Secondary | ICD-10-CM | POA: Diagnosis not present

## 2016-10-27 DIAGNOSIS — F339 Major depressive disorder, recurrent, unspecified: Secondary | ICD-10-CM

## 2016-10-27 DIAGNOSIS — F40248 Other situational type phobia: Secondary | ICD-10-CM

## 2016-10-27 DIAGNOSIS — E786 Lipoprotein deficiency: Secondary | ICD-10-CM | POA: Diagnosis not present

## 2016-10-27 DIAGNOSIS — E66811 Obesity, class 1: Secondary | ICD-10-CM

## 2016-10-27 DIAGNOSIS — K219 Gastro-esophageal reflux disease without esophagitis: Secondary | ICD-10-CM

## 2016-10-27 DIAGNOSIS — Z79899 Other long term (current) drug therapy: Secondary | ICD-10-CM | POA: Diagnosis not present

## 2016-10-27 DIAGNOSIS — F819 Developmental disorder of scholastic skills, unspecified: Secondary | ICD-10-CM

## 2016-10-27 DIAGNOSIS — Z113 Encounter for screening for infections with a predominantly sexual mode of transmission: Secondary | ICD-10-CM

## 2016-10-27 MED ORDER — ALPRAZOLAM ER 0.5 MG PO TB24: 0.5000 mg | ORAL_TABLET | Freq: Every day | ORAL | 2 refills | Status: DC

## 2016-10-27 MED ORDER — PAROXETINE HCL 40 MG PO TABS: 40.0000 mg | ORAL_TABLET | Freq: Every day | ORAL | 1 refills | Status: DC

## 2016-10-27 MED ORDER — ALPRAZOLAM 0.5 MG PO TABS: 0.5000 mg | ORAL_TABLET | Freq: Every day | ORAL | 0 refills | Status: DC | PRN

## 2016-10-27 MED ORDER — ATENOLOL 25 MG PO TABS: 25.0000 mg | ORAL_TABLET | Freq: Every day | ORAL | 0 refills | Status: DC | PRN

## 2016-10-27 NOTE — Progress Notes (Signed)
Name: Mason Parker   MRN: [id number]    DOB: 02-Sep-1991   Date:10/27/2016       Progress Note  Subjective  Chief Complaint  Chief Complaint  Patient presents with  . Anxiety    3 month follow up  . Depression  . Obesity    HPI  Obesity: his weight has been stable, he had lost weight prior to last visit, but did not gained weight since last visit. He is eating a lower carbohydrate diet most of the time.  Snoring:ESS in our office is 3. We scheduled a sleep study, but he said he is doing better and is feeling well.   Major Depression and GAD: . He is doing okday Paxil 40 mg,some transitions since last visit. Girlfriend broke up with him, had finals at school, now doing mandatory public speaking at [employer] and it makes him more nervous. He also states dark days makes him feel down. He denies suicidal thoughts or ideation.   Learning Disability: he is back at [employer], last semester he had straight A's - he states he needs to spend a lot of time studying to compensate for his grades.   Dyslipidemia: low HDL, he is due for repeat labs.   GERD: he is doing okay, usually triggered by eating unhealthy or skipping meals. Currently taking Zantac prn    Patient Active Problem List   Diagnosis Date Noted  . 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 Recurrent dislocation of right shoulder 07/16/2015  . Verruca warts (infectious) 06/03/2015  . Anxiety, generalized 02/25/2015  . Insomnia 02/25/2015  . Gastro-esophageal reflux disease without esophagitis 02/25/2015  . Chronic recurrent major depressive disorder (HCC) 02/25/2015  . Obesity (BMI 30.0-34.9) 02/25/2015  . 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 Perennial allergic rhinitis with seasonal variation 02/25/2015  . Learning disability 02/25/2015    Past Surgical History:  Procedure Laterality Date  . CIRCUMCISION  1991  . SHOULDER ARTHROSCOPY Right 09/09/2015   Procedure: ARTHROSCOPY SHOULDER, RIGHT SHOULDER  ATHROSCOPIC ANTERIOR AND POSTERIOR LABRAL TEAR REPAIR, CAPSULORRHAPHY;  Surgeon: Juanell FairlyKevin Krasinski, MD;  Location: [facility] ORS;  Service: Orthopedics;  Laterality: Right;    Family History  Problem Relation Age of Onset  . Bipolar disorder Sister   . Depression Brother   . Hypertension Mother   . Heart disease Mother   . Depression Mother   . Hypertension Father   . Bipolar disorder Father   . Hyperlipidemia Father   . Obesity Father     Social History   Social History  . Marital status: Single    Spouse name: N/A  . Number of children: N/A  . Years of education: N/A   Occupational History  . Not on file.   Social History Main Topics  . Smoking status: Never Smoker  . Smokeless tobacco: Never Used  . Alcohol use No  . Drug use: No  . Sexual activity: Not Currently   Other Topics Concern  . Not on file   Social History Narrative  . No narrative on file     Current Outpatient Prescriptions:  .  ALPRAZolam (XANAX XR) 0.5 MG 24 hr tablet, Take 1 tablet (0.5 mg total) by mouth daily., Disp: 30 tablet, Rfl: 2 .  ALPRAZolam (XANAX) 0.5 MG tablet, Take 1 tablet (0.5 mg total) by mouth daily as needed for anxiety. Must last 90 days, Disp: 30 tablet, Rfl: 0 .  atenolol (TENORMIN) 25 MG tablet, Take 1 tablet (25 mg total) by mouth daily  as needed., Disp: 30 tablet, Rfl: 0 .  PARoxetine (PAXIL) 40 MG tablet, Take 1 tablet (40 mg total) by mouth at bedtime., Disp: 90 tablet, Rfl: 1  Allergies  Allergen Reactions  . Other Swelling    YOGURT  . Shellfish Allergy Hives     ROS  Constitutional: Negative for fever or weight change.  Respiratory: Negative for cough and shortness of breath.   Cardiovascular: Negative for chest pain or palpitations.  Gastrointestinal: Negative for abdominal pain, no bowel changes.  Musculoskeletal: Negative for gait problem or joint swelling.  Skin: Negative for rash.  Neurological: Negative for dizziness or headache.  No other specific  complaints in a complete review of systems (except as listed in HPI above).  Objective  Vitals:   10/27/16 1502  BP: 138/82  Pulse: 97  Resp: 16  Temp: 98 F (36.7 C)  SpO2: 97%  Weight: 236 lb 7 oz (107.2 kg)  Height: 6' (1.829 m)    Body mass index is 32.07 kg/m.  Physical Exam  Constitutional: Patient appears well-developed and well-nourished. Obese  No distress.  HEENT: head atraumatic, normocephalic, pupils equal and reactive to light,neck supple, throat within normal limits Cardiovascular: Normal rate, regular rhythm and normal heart sounds.  No murmur heard. No BLE edema. Pulmonary/Chest: Effort normal and breath sounds normal. No respiratory distress. Abdominal: Soft.  There is no tenderness. Psychiatric: Patient has a normal mood and affect. behavior is normal. Judgment and thought content normal.   PHQ2/9: Depression screen [facility] 2/9 10/27/2016 07/11/2016 04/12/2016 11/29/2015 05/05/2015  Decreased Interest 0 2 1 0 0  Down, Depressed, Hopeless 0 2 1 0 0  PHQ - 2 Score 0 4 2 0 0  Altered sleeping - 0 3 - -  Tired, decreased energy - 0 2 - -  Change in appetite - 1 1 - -  Feeling bad or failure about yourself  - 0 1 - -  Trouble concentrating - 0 0 - -  Moving slowly or fidgety/restless - 0 0 - -  Suicidal thoughts - 0 0 - -  PHQ-9 Score - 5 9 - -  Difficult doing work/chores - Not difficult at all Somewhat difficult - -     Fall Risk: Fall Risk  07/11/2016 04/12/2016 11/29/2015 05/05/2015 02/25/2015  Falls in the past year? No No No No No    Assessment & Plan  1. Anxiety, generalized  - PARoxetine (PAXIL) 40 MG tablet; Take 1 tablet (40 mg total) by mouth at bedtime.  Dispense: 90 tablet; Refill: 1 - ALPRAZolam (XANAX) 0.5 MG tablet; Take 1 tablet (0.5 mg total) by mouth daily as needed for anxiety. Must last 90 days  Dispense: 30 tablet; Refill: 0 - ALPRAZolam (XANAX XR) 0.5 MG 24 hr tablet; Take 1 tablet (0.5 mg total) by mouth daily.  Dispense: 30 tablet;  Refill: 2  2. Chronic recurrent major depressive disorder (HCC)  - PARoxetine (PAXIL) 40 MG tablet; Take 1 tablet (40 mg total) by mouth at bedtime.  Dispense: 90 tablet; Refill: 1  3. Obesity (BMI 30.0-34.9)  - Hemoglobin A1c - Insulin, fasting  4. Learning disability  Doing well at [employer]  5. Gastro-esophageal reflux disease without esophagitis  He still has heartburn intermittently takes Zantac   6. Routine screening for STI (sexually transmitted infection)  - RPR - HIV antibody - Chlamydia/Gonococcus/Trichomonas, NAA  7. Low HDL (under 40)  - Lipid panel  8. Long-term use of high-risk medication  - COMPLETE METABOLIC PANEL WITH GFR -  CBC with Differential/Platelet  9. Fear of public speaking  - atenolol (TENORMIN) 25 MG tablet; Take 1 tablet (25 mg total) by mouth daily as needed.  Dispense: 30 tablet; Refill: 0

## 2017-02-02 ENCOUNTER — Ambulatory Visit: Payer: 59 | Admitting: Family Medicine

## 2017-03-12 ENCOUNTER — Ambulatory Visit (INDEPENDENT_AMBULATORY_CARE_PROVIDER_SITE_OTHER): Payer: 59 | Admitting: Family Medicine

## 2017-03-12 ENCOUNTER — Encounter: Payer: Self-pay | Admitting: Family Medicine

## 2017-03-12 VITALS — BP 136/72 | HR 82 | Temp 98.4°F | Resp 16 | Ht 72.0 in | Wt 234.4 lb

## 2017-03-12 DIAGNOSIS — R030 Elevated blood-pressure reading, without diagnosis of hypertension: Secondary | ICD-10-CM | POA: Insufficient documentation

## 2017-03-12 DIAGNOSIS — F411 Generalized anxiety disorder: Secondary | ICD-10-CM

## 2017-03-12 DIAGNOSIS — F339 Major depressive disorder, recurrent, unspecified: Secondary | ICD-10-CM | POA: Diagnosis not present

## 2017-03-12 DIAGNOSIS — E786 Lipoprotein deficiency: Secondary | ICD-10-CM

## 2017-03-12 DIAGNOSIS — S43004S Unspecified dislocation of right shoulder joint, sequela: Secondary | ICD-10-CM

## 2017-03-12 DIAGNOSIS — J302 Other seasonal allergic rhinitis: Secondary | ICD-10-CM | POA: Diagnosis not present

## 2017-03-12 DIAGNOSIS — J3089 Other allergic rhinitis: Secondary | ICD-10-CM | POA: Diagnosis not present

## 2017-03-12 DIAGNOSIS — E669 Obesity, unspecified: Secondary | ICD-10-CM

## 2017-03-12 DIAGNOSIS — K219 Gastro-esophageal reflux disease without esophagitis: Secondary | ICD-10-CM

## 2017-03-12 DIAGNOSIS — F40248 Other situational type phobia: Secondary | ICD-10-CM

## 2017-03-12 MED ORDER — ATENOLOL 25 MG PO TABS: 25.0000 mg | ORAL_TABLET | Freq: Every evening | ORAL | 0 refills | Status: DC | PRN

## 2017-03-12 MED ORDER — PAROXETINE HCL 40 MG PO TABS: 40.0000 mg | ORAL_TABLET | Freq: Every day | ORAL | 1 refills | Status: DC

## 2017-03-12 MED ORDER — ALPRAZOLAM ER 0.5 MG PO TB24: 0.5000 mg | ORAL_TABLET | Freq: Every day | ORAL | 2 refills | Status: DC

## 2017-03-12 NOTE — Progress Notes (Signed)
Name: Mason Parker   MRN: [id number]    DOB: 1991-03-01   Date:03/12/2017       Progress Note  Subjective  Chief Complaint  Chief Complaint  Patient presents with  . Medication Refill  . 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    . Gastroesophageal Reflux    Has been giving him more trouble recently, having more acid coming back up.   . Shoulder Pain    Yesterday his right shoulder has been giving sharp pain intermittently.     HPI   Obesity: his weight has been stable, he lost 2 lbs since last visit. Marland Kitchen He is eating a lower carbohydrate diet most of the time.  Snoring:ESS in our office is 3. He did not have a sleep study.   Major Depression and GAD: .He was doing well on medications, however father was laid off on May 31st, and he has been very stressed about it, he is not sleeping well at night, no motivation, difficulty focusing. He needs to be researching for scholarship and has not been able to apply for any other schools, since he is finishing [employer]. He states his appetite has increased because of stress. He has kept a good hygiene. He denies suicidal thoughts. He denies homicidal thoughts.   Learning Disability: he is back at [employer], last semester he had straight A's - he states he needs to spend a lot of time studying to compensate for his grades.   Right shoulder pain: history of right shoulder dislocation and surgery, he states he was doing well, but yesterday he noticed a stabbing pain last night, he did not have any injury or increase in activity. He rested his shoulder and symptoms resolved, feeling well today, advised to contact surgeon if happens again.   Dyslipidemia: low HDL, he is due for repeat labs.   GERD: he is doing okay, usually triggered by eating unhealthy or skipping meals. Currently taking Zantac prn   AR: he denies sneezing, or rhinorrhea.   Patient Active Problem List   Diagnosis Date  Noted  . Elevated blood-pressure reading without diagnosis of hypertension 03/12/2017  . 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 Recurrent dislocation of right shoulder 07/16/2015  . Verruca warts (infectious) 06/03/2015  . Anxiety, generalized 02/25/2015  . Insomnia 02/25/2015  . Gastro-esophageal reflux disease without esophagitis 02/25/2015  . Chronic recurrent major depressive disorder (HCC) 02/25/2015  . Obesity (BMI 30.0-34.9) 02/25/2015  . 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 Perennial allergic rhinitis with seasonal variation 02/25/2015  . Learning disability 02/25/2015    Past Surgical History:  Procedure Laterality Date  . CIRCUMCISION  1991  . SHOULDER ARTHROSCOPY Right 09/09/2015   Procedure: ARTHROSCOPY SHOULDER, RIGHT SHOULDER ATHROSCOPIC ANTERIOR AND POSTERIOR LABRAL TEAR REPAIR, CAPSULORRHAPHY;  Surgeon: Juanell Fairly, MD;  Location: [facility] ORS;  Service: Orthopedics;  Laterality: Right;    Family History  Problem Relation Age of Onset  . Bipolar disorder Sister   . Depression Brother   . Hypertension Mother   . Heart disease Mother   . Depression Mother   . Hypertension Father   . Bipolar disorder Father   . Hyperlipidemia Father   . Obesity Father     Social History   Social History  . Marital status: Single    Spouse name: N/A  . Number of children: N/A  . Years of education: N/A  Occupational History  . Not on file.   Social History Main Topics  . Smoking status: Never Smoker  . Smokeless tobacco: Never Used  . Alcohol use No  . Drug use: No  . Sexual activity: Not Currently   Other Topics Concern  . Not on file   Social History Narrative  . No narrative on file     Current Outpatient Prescriptions:  .  ALPRAZolam (XANAX XR) 0.5 MG 24 hr tablet, Take 1 tablet (0.5 mg total) by mouth daily., Disp: 30 tablet, Rfl: 2 .  atenolol (TENORMIN) 25 MG tablet, Take 1 tablet (25 mg total) by mouth at bedtime as  needed., Disp: 30 tablet, Rfl: 0 .  PARoxetine (PAXIL) 40 MG tablet, Take 1 tablet (40 mg total) by mouth at bedtime., Disp: 90 tablet, Rfl: 1  Allergies  Allergen Reactions  . Other Swelling    YOGURT  . Shellfish Allergy Hives     ROS  Constitutional: Negative for fever or weight change.  Respiratory: Negative for cough and shortness of breath.   Cardiovascular: Negative for chest pain or palpitations.  Gastrointestinal: Negative for abdominal pain, no bowel changes.  Musculoskeletal: Negative for gait problem or joint swelling.  Skin: Negative for rash.  Neurological: Negative for dizziness , positive for dull headache.  No other specific complaints in a complete review of systems (except as listed in HPI above).  Objective  Vitals:   03/12/17 1625  BP: (!) 152/100  Pulse: 82  Resp: 16  Temp: 98.4 F (36.9 C)  TempSrc: Oral  SpO2: 98%  Weight: 234 lb 6.4 oz (106.3 kg)  Height: 6' (1.829 m)    Body mass index is 31.79 kg/m.  Physical Exam  Constitutional: Patient appears well-developed and well-nourished. Obese  No distress.  HEENT: head atraumatic, normocephalic, pupils equal and reactive to light, neck supple, throat within normal limits Cardiovascular: Normal rate, regular rhythm and normal heart sounds.  No murmur heard. No BLE edema. Pulmonary/Chest: Effort normal and breath sounds normal. No respiratory distress. Abdominal: Soft.  There is no tenderness. Psychiatric: Patient has a normal mood and affect. behavior is normal. Judgment and thought content normal.   PHQ2/9: Depression screen [facility] 2/9 03/12/2017 10/27/2016 07/11/2016 04/12/2016 11/29/2015  Decreased Interest 3 0 2 1 0  Down, Depressed, Hopeless 3 0 2 1 0  PHQ - 2 Score 6 0 4 2 0  Altered sleeping 3 - 0 3 -  Tired, decreased energy 2 - 0 2 -  Change in appetite 0 - 1 1 -  Feeling bad or failure about yourself  3 - 0 1 -  Trouble concentrating 1 - 0 0 -  Moving slowly or fidgety/restless 1 - 0 0  -  Suicidal thoughts 0 - 0 0 -  PHQ-9 Score 16 - 5 9 -  Difficult doing work/chores Somewhat difficult - Not difficult at all Somewhat difficult -     Fall Risk: Fall Risk  03/12/2017 07/11/2016 04/12/2016 11/29/2015 05/05/2015  Falls in the past year? No No No No No     Functional Status Survey: Is the patient deaf or have difficulty hearing?: No Does the patient have difficulty seeing, even when wearing glasses/contacts?: No Does the patient have difficulty concentrating, remembering, or making decisions?: No Does the patient have difficulty walking or climbing stairs?: No Does the patient have difficulty dressing or bathing?: No Does the patient have difficulty doing errands alone such as visiting a doctor's office or shopping?: No  Assessment & Plan  1. Elevated blood-pressure reading without diagnosis of hypertension  He is very nervous, father lost his job, he states changes stresses him out more than anything, we will start Atenolol qhs and we will recheck in 3 weeks, may need to start HCTZ - atenolol (TENORMIN) 25 MG tablet; Take 1 tablet (25 mg total) by mouth at bedtime as needed.  Dispense: 30 tablet; Refill: 0  2. Anxiety, generalized  - ALPRAZolam (XANAX XR) 0.5 MG 24 hr tablet; Take 1 tablet (0.5 mg total) by mouth daily.  Dispense: 30 tablet; Refill: 2 - PARoxetine (PAXIL) 40 MG tablet; Take 1 tablet (40 mg total) by mouth at bedtime.  Dispense: 90 tablet; Refill: 1  3. Chronic recurrent major depressive disorder (HCC)  - PARoxetine (PAXIL) 40 MG tablet; Take 1 tablet (40 mg total) by mouth at bedtime.  Dispense: 90 tablet; Refill: 1  4. Low HDL (under 40)  Lipid panel shows low HDL : to improve HDL patient  needs to eat tree nuts ( pecans/pistachios/almonds ) four times weekly, eat fish two times weekly  and exercise  at least 150 minutes per week  5. Obesity (BMI 30.0-34.9)  Discussed with the patient the risk posed by an increased BMI. Discussed importance  of portion control, calorie counting and at least 150 minutes of physical activity weekly. Avoid sweet beverages and drink more water. Eat at least 6 servings of fruit and vegetables daily   6. Fear of public speaking  - atenolol (TENORMIN) 25 MG tablet; Take 1 tablet (25 mg total) by mouth at bedtime as needed.  Dispense: 30 tablet; Refill: 0  9. Gastro-esophageal reflux disease without esophagitis  Continue medication prn.

## 2017-03-14 ENCOUNTER — Ambulatory Visit: Payer: 59 | Admitting: Family Medicine

## 2017-09-14 ENCOUNTER — Encounter: Payer: Self-pay | Admitting: Family Medicine

## 2017-09-14 ENCOUNTER — Ambulatory Visit
Admission: RE | Admit: 2017-09-14 | Discharge: 2017-09-14 | Disposition: A | Discharge status: Home / Self Care | Payer: 59 | Source: Ambulatory Visit | Attending: Family Medicine | Admitting: Family Medicine

## 2017-09-14 ENCOUNTER — Ambulatory Visit (INDEPENDENT_AMBULATORY_CARE_PROVIDER_SITE_OTHER): Payer: 59 | Admitting: Family Medicine

## 2017-09-14 VITALS — BP 144/100 | HR 78 | Temp 98.2°F | Resp 18 | Ht 72.0 in | Wt 239.1 lb

## 2017-09-14 DIAGNOSIS — M542 Cervicalgia: Secondary | ICD-10-CM

## 2017-09-14 DIAGNOSIS — F411 Generalized anxiety disorder: Secondary | ICD-10-CM

## 2017-09-14 DIAGNOSIS — Z23 Encounter for immunization: Secondary | ICD-10-CM

## 2017-09-14 DIAGNOSIS — R030 Elevated blood-pressure reading, without diagnosis of hypertension: Secondary | ICD-10-CM | POA: Diagnosis not present

## 2017-09-14 DIAGNOSIS — F40248 Other situational type phobia: Secondary | ICD-10-CM

## 2017-09-14 MED ORDER — MELOXICAM 7.5 MG PO TABS: 7.5000 mg | ORAL_TABLET | Freq: Every day | ORAL | 0 refills | Status: DC

## 2017-09-14 MED ORDER — ATENOLOL 25 MG PO TABS: 25.0000 mg | ORAL_TABLET | Freq: Every evening | ORAL | 0 refills | Status: DC | PRN

## 2017-09-14 MED ORDER — CYCLOBENZAPRINE HCL 10 MG PO TABS
10.0000 mg | ORAL_TABLET | Freq: Three times a day (TID) | ORAL | 0 refills | Status: DC | PRN
Start: 2017-09-14 — End: 2017-09-24

## 2017-09-14 NOTE — Addendum Note (Signed)
Addended by: Doren CustardBOYCE, EMILY E on: 09/14/2017 11:56 AM   Modules accepted: Orders

## 2017-09-14 NOTE — Progress Notes (Addendum)
Name: Mason Parker   MRN: [id number]    DOB: 05-Aug-1991   Date:09/14/2017       Progress Note  Subjective  Chief Complaint  Chief Complaint  Patient presents with  . Neck Injury    Put a heavy arm chair on his back and pulled his neck muscle on Wednesday last week. Patient states the next day he started to hurt alot and has been putting heat on it and taking Ibuprofen and Tylenol for injury.     HPI  Pt presents with neck pain x9 days - he was lifting an armchair over his head and rested it on his head to carry it to the truck that he was putting it into.  He took a friends' flexeril x1 and this helped significantly.  Took Aleve and Ibuprofen without relief.  Patient has been off of Xanax for a few months and has been doing well in college off of this and Paxil.  He is out of his Atenolol but had been doing well on this prior to running out.  Using his dog as an emotional support animal to help him relax.  He sees a Veterinary surgeoncounselor once a month (used to go more frequently, but hasn't needed it as often lately.)  Patient Active Problem List   Diagnosis Date Noted  . Elevated blood-pressure reading without diagnosis of hypertension 03/12/2017  . 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 Recurrent dislocation of right shoulder 07/16/2015  . Verruca warts (infectious) 06/03/2015  . Anxiety, generalized 02/25/2015  . Insomnia 02/25/2015  . Gastro-esophageal reflux disease without esophagitis 02/25/2015  . Chronic recurrent major depressive disorder (HCC) 02/25/2015  . Obesity (BMI 30.0-34.9) 02/25/2015  . 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 Perennial allergic rhinitis with seasonal variation 02/25/2015  . Learning disability 02/25/2015    Social History   Tobacco Use  . Smoking status: Never Smoker  . Smokeless tobacco: Never Used  Substance Use Topics  . Alcohol use: No    Alcohol/week: 0.0 oz     Current Outpatient Medications:  .  atenolol  (TENORMIN) 25 MG tablet, Take 1 tablet (25 mg total) by mouth at bedtime as needed., Disp: 30 tablet, Rfl: 0 .  PARoxetine (PAXIL) 40 MG tablet, Take 1 tablet (40 mg total) by mouth at bedtime. (Patient not taking: Reported on 09/14/2017), Disp: 90 tablet, Rfl: 1  Allergies  Allergen Reactions  . Other Swelling    YOGURT  . Shellfish Allergy Hives    ROS  Constitutional: Negative for fever or weight change.  Respiratory: Negative for cough and shortness of breath.   Cardiovascular: Negative for chest pain or palpitations.  Gastrointestinal: Negative for abdominal pain, no bowel changes.  Musculoskeletal: Negative for gait problem or joint swelling. No numbness/tingling, no extremity weakness Skin: Negative for rash.  Neurological: Negative for dizziness. No vision changes.  Endorses occasional headache. No other specific complaints in a complete review of systems (except as listed in HPI above).  Objective  Vitals:   09/14/17 1052 09/14/17 1127  BP: (!) 158/100 (!) 144/100  Pulse: (!) 113 78  Resp: 18   Temp: 98.2 F (36.8 C)   TempSrc: Oral   SpO2: 97%   Weight: 239 lb 1.6 oz (108.5 kg)   Height: 6' (1.829 m)    Body mass index is 32.43 kg/m.  Nursing Note and Vital Signs reviewed.  BP remaining elevated - we will restart Atenolol and recheck in 2 weeks.  Physical Exam  Constitutional: Patient  appears well-developed and well-nourished. Obese No distress.  HEENT: head atraumatic, normocephalic Neck: neck supple without lymphadenopathy, full AROM with some hesitation from pain.  Strength intact.  No step-offs or crepitus, tenderness to central neck on palpation. Cardiovascular: Normal rate, regular rhythm, S1/S2 present.  No murmur or rub heard. No BLE edema.  Central posterior neck tenderness, we will Xray today to ensure no fracture present. Pulmonary/Chest: Effort normal and breath sounds clear. No respiratory distress or retractions. Psychiatric: Patient has a normal  mood and affect. behavior is normal. Judgment and thought content normal. Musculoskeletal: Normal range of motion, no joint effusions. No gross deformities Neurological: he is alert and oriented to person, place, and time. No cranial nerve deficit. Coordination, balance, strength, speech and gait are normal.  Skin: Skin is warm and dry. No rash noted. No erythema.   No results found for this or any previous visit (from the past 2160 hour(s)).   Assessment & Plan  1. Neck pain, acute - meloxicam (MOBIC) 7.5 MG tablet; Take 1 tablet (7.5 mg total) by mouth daily. Take with Food  Dispense: 20 tablet; Refill: 0 - cyclobenzaprine (FLEXERIL) 10 MG tablet; Take 1 tablet (10 mg total) by mouth 3 (three) times daily as needed for muscle spasms.  Dispense: 20 tablet; Refill: 0 - DG Cervical Spine Complete; Future  2. Anxiety, generalized - atenolol (TENORMIN) 25 MG tablet; Take 1 tablet (25 mg total) by mouth at bedtime as needed.  Dispense: 90 tablet; Refill: 0  3. Fear of public speaking - atenolol (TENORMIN) 25 MG tablet; Take 1 tablet (25 mg total) by mouth at bedtime as needed.  Dispense: 90 tablet; Refill: 0  4. Elevated blood-pressure reading without diagnosis of hypertension - atenolol (TENORMIN) 25 MG tablet; Take 1 tablet (25 mg total) by mouth at bedtime as needed.  Dispense: 90 tablet; Refill: 0 - 2 Week BP check  5. Needs flu shot - Flu Vaccine QUAD 6+ mos PF IM (Fluarix Quad PF)  -Red flags and when to present for emergency care or RTC including fever >101.24F, chest pain, shortness of breath, new/worsening/un-resolving symptoms, weakness, numbness/tingling, reviewed with patient at time of visit. Follow up and care instructions discussed and provided in AVS.

## 2017-09-14 NOTE — Patient Instructions (Addendum)
DO NOT START EXERCISES UNTIL AFTER XRAY RESULTS HAVE BEEN CALLED TO YOU.  Cervical Strain and Sprain Rehab Ask your health care provider which exercises are safe for you. Do exercises exactly as told by your health care provider and adjust them as directed. It is normal to feel mild stretching, pulling, tightness, or discomfort as you do these exercises, but you should stop right away if you feel sudden pain or your pain gets worse.Do not begin these exercises until told by your health care provider. Stretching and range of motion exercises These exercises warm up your muscles and joints and improve the movement and flexibility of your neck. These exercises also help to relieve pain, numbness, and tingling. Exercise A: Cervical side bend  1. Using good posture, sit on a stable chair or stand up. 2. Without moving your shoulders, slowly tilt your left / right ear to your shoulder until you feel a stretch in your neck muscles. You should be looking straight ahead. 3. Hold for __________ seconds. 4. Repeat with the other side of your neck. Repeat __________ times. Complete this exercise __________ times a day. Exercise B: Cervical rotation  1. Using good posture, sit on a stable chair or stand up. 2. Slowly turn your head to the side as if you are looking over your left / right shoulder. ? Keep your eyes level with the ground. ? Stop when you feel a stretch along the side and the back of your neck. 3. Hold for __________ seconds. 4. Repeat this by turning to your other side. Repeat __________ times. Complete this exercise __________ times a day. Exercise C: Thoracic extension and pectoral stretch 1. Roll a towel or a small blanket so it is about 4 inches (10 cm) in diameter. 2. Lie down on your back on a firm surface. 3. Put the towel lengthwise, under your spine in the middle of your back. It should not be not under your shoulder blades. The towel should line up with your spine from your  middle back to your lower back. 4. Put your hands behind your head and let your elbows fall out to your sides. 5. Hold for __________ seconds. Repeat __________ times. Complete this exercise __________ times a day. Strengthening exercises These exercises build strength and endurance in your neck. Endurance is the ability to use your muscles for a long time, even after your muscles get tired. Exercise D: Upper cervical flexion, isometric 1. Lie on your back with a thin pillow behind your head and a small rolled-up towel under your neck. 2. Gently tuck your chin toward your chest and nod your head down to look toward your feet. Do not lift your head off the pillow. 3. Hold for __________ seconds. 4. Release the tension slowly. Relax your neck muscles completely before you repeat this exercise. Repeat __________ times. Complete this exercise __________ times a day. Exercise E: Cervical extension, isometric  1. Stand about 6 inches (15 cm) away from a wall, with your back facing the wall. 2. Place a soft object, about 6-8 inches (15-20 cm) in diameter, between the back of your head and the wall. A soft object could be a small pillow, a ball, or a folded towel. 3. Gently tilt your head back and press into the soft object. Keep your jaw and forehead relaxed. 4. Hold for __________ seconds. 5. Release the tension slowly. Relax your neck muscles completely before you repeat this exercise. Repeat __________ times. Complete this exercise __________ times a day.  Posture and body mechanics  Body mechanics refers to the movements and positions of your body while you do your daily activities. Posture is part of body mechanics. Good posture and healthy body mechanics can help to relieve stress in your body's tissues and joints. Good posture means that your spine is in its natural S-curve position (your spine is neutral), your shoulders are pulled back slightly, and your head is not tipped forward. The  following are general guidelines for applying improved posture and body mechanics to your everyday activities. Standing  When standing, keep your spine neutral and keep your feet about hip-width apart. Keep a slight bend in your knees. Your ears, shoulders, and hips should line up.  When you do a task in which you stand in one place for a long time, place one foot up on a stable object that is 2-4 inches (5-10 cm) high, such as a footstool. This helps keep your spine neutral. Sitting   When sitting, keep your spine neutral and your keep feet flat on the floor. Use a footrest, if necessary, and keep your thighs parallel to the floor. Avoid rounding your shoulders, and avoid tilting your head forward.  When working at a desk or a computer, keep your desk at a height where your hands are slightly lower than your elbows. Slide your chair under your desk so you are close enough to maintain good posture.  When working at a computer, place your monitor at a height where you are looking straight ahead and you do not have to tilt your head forward or downward to look at the screen. Resting When lying down and resting, avoid positions that are most painful for you. Try to support your neck in a neutral position. You can use a contour pillow or a small rolled-up towel. Your pillow should support your neck but not push on it. This information is not intended to replace advice given to you by your health care provider. Make sure you discuss any questions you have with your health care provider. Document Released: 09/04/2005 Document Revised: 05/11/2016 Document Reviewed: 08/11/2015 Elsevier Interactive Patient Education  [employer]2018 Elsevier Inc.

## 2017-09-17 ENCOUNTER — Telehealth: Payer: Self-pay | Admitting: Family Medicine

## 2017-09-17 NOTE — Telephone Encounter (Signed)
Patient was notified of X ray results by Santiago Bumpersrystal Booker, CMA.

## 2017-09-17 NOTE — Telephone Encounter (Signed)
Copied from CRM [phone]. Topic: Quick Communication - See Telephone Encounter >> Sep 17, 2017  9:32 AM Rudi CocoLathan, Berish Bohman M, NT wrote: CRM for notification. See Telephone encounter for:   09/17/17.pt. Calling back to receive results from x-ray results pt. Can be reached at  [phone]

## 2017-09-24 ENCOUNTER — Ambulatory Visit (INDEPENDENT_AMBULATORY_CARE_PROVIDER_SITE_OTHER): Payer: 59 | Admitting: Family Medicine

## 2017-09-24 ENCOUNTER — Encounter: Payer: Self-pay | Admitting: Family Medicine

## 2017-09-24 VITALS — BP 142/78 | HR 90 | Temp 98.4°F | Resp 16 | Ht 72.0 in | Wt 241.4 lb

## 2017-09-24 DIAGNOSIS — K219 Gastro-esophageal reflux disease without esophagitis: Secondary | ICD-10-CM

## 2017-09-24 DIAGNOSIS — Z131 Encounter for screening for diabetes mellitus: Secondary | ICD-10-CM | POA: Diagnosis not present

## 2017-09-24 DIAGNOSIS — F339 Major depressive disorder, recurrent, unspecified: Secondary | ICD-10-CM

## 2017-09-24 DIAGNOSIS — E786 Lipoprotein deficiency: Secondary | ICD-10-CM | POA: Diagnosis not present

## 2017-09-24 DIAGNOSIS — F40248 Other situational type phobia: Secondary | ICD-10-CM

## 2017-09-24 DIAGNOSIS — E669 Obesity, unspecified: Secondary | ICD-10-CM | POA: Diagnosis not present

## 2017-09-24 DIAGNOSIS — F819 Developmental disorder of scholastic skills, unspecified: Secondary | ICD-10-CM

## 2017-09-24 DIAGNOSIS — R030 Elevated blood-pressure reading, without diagnosis of hypertension: Secondary | ICD-10-CM | POA: Diagnosis not present

## 2017-09-24 DIAGNOSIS — Z113 Encounter for screening for infections with a predominantly sexual mode of transmission: Secondary | ICD-10-CM

## 2017-09-24 DIAGNOSIS — E66811 Obesity, class 1: Secondary | ICD-10-CM

## 2017-09-24 DIAGNOSIS — F411 Generalized anxiety disorder: Secondary | ICD-10-CM

## 2017-09-24 MED ORDER — PAROXETINE HCL 40 MG PO TABS: 40.0000 mg | ORAL_TABLET | Freq: Every day | ORAL | 1 refills | Status: DC

## 2017-09-24 MED ORDER — ATENOLOL 25 MG PO TABS: 25.0000 mg | ORAL_TABLET | Freq: Every evening | ORAL | 0 refills | Status: DC | PRN

## 2017-09-24 MED ORDER — OMEPRAZOLE 40 MG PO CPDR: 40.0000 mg | DELAYED_RELEASE_CAPSULE | Freq: Every day | ORAL | 3 refills | Status: DC

## 2017-09-24 NOTE — Progress Notes (Signed)
Name: Mason Parker   MRN: [id number]    DOB: 1990-10-26   Date:09/24/2017       Progress Note  Subjective  Chief Complaint  Chief Complaint  Patient presents with  . Follow-up  . Hypertension    Started on Atenolol last visit   . Anxiety    Will throw up when he has anxiety attacks  . Neck Pain    Has resolved with medication but worst with exercises    HPI   Obesity: he gained weight since last visit, but is trying to exercise again and changing his diet. Eating a more balanced diet  Snoring:ESS in our office is 3. He did not have a sleep study.   Major Depression and GAD: .He was doing well on medications, however father was laid off on May 31st, and he was very stressed out , but he is getting better now, father may be opening a restaurant soon  Learning Disability: he is back at [employer], last semester he had straight A's - he states he needs to spend a lot of time studying to compensate for his grades.   Dyslipidemia: low HDL, he is due for repeat labs.   GERD: he is getting worsening of symptoms we will try PPI again   AR: he denies sneezing, or rhinorrhea.   Patient Active Problem List   Diagnosis Date Noted  . Elevated blood-pressure reading without diagnosis of hypertension 03/12/2017  . 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 Recurrent dislocation of right shoulder 07/16/2015  . Verruca warts (infectious) 06/03/2015  . Anxiety, generalized 02/25/2015  . Insomnia 02/25/2015  . Gastro-esophageal reflux disease without esophagitis 02/25/2015  . Chronic recurrent major depressive disorder (HCC) 02/25/2015  . Obesity (BMI 30.0-34.9) 02/25/2015  . 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 Perennial allergic rhinitis with seasonal variation 02/25/2015  . Learning disability 02/25/2015    Past Surgical History:  Procedure Laterality Date  . CIRCUMCISION  1991  . SHOULDER ARTHROSCOPY Right 09/09/2015   Procedure: ARTHROSCOPY SHOULDER,  RIGHT SHOULDER ATHROSCOPIC ANTERIOR AND POSTERIOR LABRAL TEAR REPAIR, CAPSULORRHAPHY;  Surgeon: Juanell Fairly, MD;  Location: [facility] ORS;  Service: Orthopedics;  Laterality: Right;    Family History  Problem Relation Age of Onset  . Bipolar disorder Sister   . Depression Brother   . Hypertension Mother   . Heart disease Mother   . Depression Mother   . Hypertension Father   . Bipolar disorder Father   . Hyperlipidemia Father   . Obesity Father     Social History   Socioeconomic History  . Marital status: Single    Spouse name: Not on file  . Number of children: Not on file  . Years of education: Not on file  . Highest education level: Not on file  Social Needs  . Financial resource strain: Not on file  . Food insecurity - worry: Not on file  . Food insecurity - inability: Not on file  . Transportation needs - medical: Not on file  . Transportation needs - non-medical: Not on file  Occupational History  . Not on file  Tobacco Use  . Smoking status: Never Smoker  . Smokeless tobacco: Never Used  Substance and Sexual Activity  . Alcohol use: No    Alcohol/week: 0.0 oz  . Drug use: No  . Sexual activity: Not Currently  Other Topics Concern  . Not on file  Social History Narrative  . Not on file     Current Outpatient Medications:  .  atenolol (TENORMIN) 25 MG tablet, Take 1 tablet (25 mg total) by mouth at bedtime as needed., Disp: 90 tablet, Rfl: 0 .  PARoxetine (PAXIL) 40 MG tablet, Take 1 tablet (40 mg total) by mouth at bedtime., Disp: 90 tablet, Rfl: 1  Allergies  Allergen Reactions  . Other Swelling    YOGURT  . Shellfish Allergy Hives     ROS  Constitutional: Negative for fever or weight change.  Respiratory: Negative for cough and shortness of breath.   Cardiovascular: Negative for chest pain or palpitations.  Gastrointestinal: Negative for abdominal pain, no bowel changes.  Musculoskeletal: Negative for gait problem or joint swelling.  Skin:  Negative for rash.  Neurological: Negative for dizziness or headache.  No other specific complaints in a complete review of systems (except as listed in HPI above).  Objective  Vitals:   09/24/17 1450  BP: (!) 142/78  Pulse: 90  Resp: 16  Temp: 98.4 F (36.9 C)  TempSrc: Oral  SpO2: 95%  Weight: 241 lb 6.4 oz (109.5 kg)  Height: 6' (1.829 m)    Body mass index is 32.74 kg/m.  Physical Exam  Constitutional: Patient appears well-developed and well-nourished. Obese  No distress.  HEENT: head atraumatic, normocephalic, pupils equal and reactive to light, neck supple, throat within normal limits Cardiovascular: Normal rate, regular rhythm and normal heart sounds.  No murmur heard. No BLE edema. Pulmonary/Chest: Effort normal and breath sounds normal. No respiratory distress. Abdominal: Soft.  There is no tenderness. Psychiatric: Patient has a normal mood and affect. behavior is normal. Judgment and thought content normal.   PHQ2/9: Depression screen [facility] 2/9 09/24/2017 09/14/2017 03/12/2017 10/27/2016 07/11/2016  Decreased Interest 1 3 3  0 2  Down, Depressed, Hopeless 1 2 3  0 2  PHQ - 2 Score 2 5 6  0 4  Altered sleeping 3 3 3  - 0  Tired, decreased energy 3 3 2  - 0  Change in appetite 2 3 0 - 1  Feeling bad or failure about yourself  1 3 3  - 0  Trouble concentrating 0 0 1 - 0  Moving slowly or fidgety/restless 0 3 1 - 0  Suicidal thoughts 0 3 0 - 0  PHQ-9 Score 11 23 16  - 5  Difficult doing work/chores Somewhat difficult Somewhat difficult Somewhat difficult - Not difficult at all     Fall Risk: Fall Risk  09/14/2017 03/12/2017 07/11/2016 04/12/2016 11/29/2015  Falls in the past year? No No No No No     Assessment & Plan  1. Chronic recurrent major depressive disorder (HCC)  - PARoxetine (PAXIL) 40 MG tablet; Take 1 tablet (40 mg total) by mouth at bedtime.  Dispense: 90 tablet; Refill: 1  2. Low HDL (under 40)  - lipid panel   3. Fear of public speaking  -  atenolol (TENORMIN) 25 MG tablet; Take 1 tablet (25 mg total) by mouth at bedtime as needed.  Dispense: 90 tablet; Refill: 0  4. Obesity (BMI 30.0-34.9)  Discussed with the patient the risk posed by an increased BMI. Discussed importance of portion control, calorie counting and at least 150 minutes of physical activity weekly. Avoid sweet beverages and drink more water. Eat at least 6 servings of fruit and vegetables daily   5. Gastro-esophageal reflux disease without esophagitis  We will try Omeprazole 40 mg daily and try to wean self off after a few weeks  6. Learning disability  Going to [facility] still   7. Elevated blood-pressure reading without diagnosis of  hypertension  May have hypertension , he will try changing his diet and monitor for now - atenolol (TENORMIN) 25 MG tablet; Take 1 tablet (25 mg total) by mouth at bedtime as needed.  Dispense: 90 tablet; Refill: 0 - COMPLETE METABOLIC PANEL WITH GFR - CBC with Differential/Platelet  8. Anxiety, generalized  - atenolol (TENORMIN) 25 MG tablet; Take 1 tablet (25 mg total) by mouth at bedtime as needed.  Dispense: 90 tablet; Refill: 0 - PARoxetine (PAXIL) 40 MG tablet; Take 1 tablet (40 mg total) by mouth at bedtime.  Dispense: 90 tablet; Refill:   9. Screening for diabetes mellitus  - Hemoglobin A1c  10. Routine screening for STI (sexually transmitted infection)  - C. trachomatis/N. gonorrhoeae RNA - RPR - HIV antibody

## 2017-09-24 NOTE — Patient Instructions (Signed)
DASH Eating Plan DASH stands for "Dietary Approaches to Stop Hypertension." The DASH eating plan is a healthy eating plan that has been shown to reduce high blood pressure (hypertension). It may also reduce your risk for type 2 diabetes, heart disease, and stroke. The DASH eating plan may also help with weight loss. What are tips for following this plan? General guidelines  Avoid eating more than 2,300 mg (milligrams) of salt (sodium) a day. If you have hypertension, you may need to reduce your sodium intake to 1,500 mg a day.  Limit alcohol intake to no more than 1 drink a day for nonpregnant women and 2 drinks a day for men. One drink equals 12 oz of beer, 5 oz of wine, or 1 oz of hard liquor.  Work with your health care provider to maintain a healthy body weight or to lose weight. Ask what an ideal weight is for you.  Get at least 30 minutes of exercise that causes your heart to beat faster (aerobic exercise) most days of the week. Activities may include walking, swimming, or biking.  Work with your health care provider or diet and nutrition specialist (dietitian) to adjust your eating plan to your individual calorie needs. Reading food labels  Check food labels for the amount of sodium per serving. Choose foods with less than 5 percent of the Daily Value of sodium. Generally, foods with less than 300 mg of sodium per serving fit into this eating plan.  To find whole grains, look for the word "whole" as the first word in the ingredient list. Shopping  Buy products labeled as "low-sodium" or "no salt added."  Buy fresh foods. Avoid canned foods and premade or frozen meals. Cooking  Avoid adding salt when cooking. Use salt-free seasonings or herbs instead of table salt or sea salt. Check with your health care provider or pharmacist before using salt substitutes.  Do not fry foods. Cook foods using healthy methods such as baking, boiling, grilling, and broiling instead.  Cook with  heart-healthy oils, such as olive, canola, soybean, or sunflower oil. Meal planning   Eat a balanced diet that includes: ? 5 or more servings of fruits and vegetables each day. At each meal, try to fill half of your plate with fruits and vegetables. ? Up to 6-8 servings of whole grains each day. ? Less than 6 oz of lean meat, poultry, or fish each day. A 3-oz serving of meat is about the same size as a deck of cards. One egg equals 1 oz. ? 2 servings of low-fat dairy each day. ? A serving of nuts, seeds, or beans 5 times each week. ? Heart-healthy fats. Healthy fats called Omega-3 fatty acids are found in foods such as flaxseeds and coldwater fish, like sardines, salmon, and mackerel.  Limit how much you eat of the following: ? Canned or prepackaged foods. ? Food that is high in trans fat, such as fried foods. ? Food that is high in saturated fat, such as fatty meat. ? Sweets, desserts, sugary drinks, and other foods with added sugar. ? Full-fat dairy products.  Do not salt foods before eating.  Try to eat at least 2 vegetarian meals each week.  Eat more home-cooked food and less restaurant, buffet, and fast food.  When eating at a restaurant, ask that your food be prepared with less salt or no salt, if possible. What foods are recommended? The items listed may not be a complete list. Talk with your dietitian about what   dietary choices are best for you. Grains Whole-grain or whole-wheat bread. Whole-grain or whole-wheat pasta. Brown rice. Oatmeal. Quinoa. Bulgur. Whole-grain and low-sodium cereals. Pita bread. Low-fat, low-sodium crackers. Whole-wheat flour tortillas. Vegetables Fresh or frozen vegetables (raw, steamed, roasted, or grilled). Low-sodium or reduced-sodium tomato and vegetable juice. Low-sodium or reduced-sodium tomato sauce and tomato paste. Low-sodium or reduced-sodium canned vegetables. Fruits All fresh, dried, or frozen fruit. Canned fruit in natural juice (without  added sugar). Meat and other protein foods Skinless chicken or turkey. Ground chicken or turkey. Pork with fat trimmed off. Fish and seafood. Egg whites. Dried beans, peas, or lentils. Unsalted nuts, nut butters, and seeds. Unsalted canned beans. Lean cuts of beef with fat trimmed off. Low-sodium, lean deli meat. Dairy Low-fat (1%) or fat-free (skim) milk. Fat-free, low-fat, or reduced-fat cheeses. Nonfat, low-sodium ricotta or cottage cheese. Low-fat or nonfat yogurt. Low-fat, low-sodium cheese. Fats and oils Soft margarine without trans fats. Vegetable oil. Low-fat, reduced-fat, or light mayonnaise and salad dressings (reduced-sodium). Canola, safflower, olive, soybean, and sunflower oils. Avocado. Seasoning and other foods Herbs. Spices. Seasoning mixes without salt. Unsalted popcorn and pretzels. Fat-free sweets. What foods are not recommended? The items listed may not be a complete list. Talk with your dietitian about what dietary choices are best for you. Grains Baked goods made with fat, such as croissants, muffins, or some breads. Dry pasta or rice meal packs. Vegetables Creamed or fried vegetables. Vegetables in a cheese sauce. Regular canned vegetables (not low-sodium or reduced-sodium). Regular canned tomato sauce and paste (not low-sodium or reduced-sodium). Regular tomato and vegetable juice (not low-sodium or reduced-sodium). Pickles. Olives. Fruits Canned fruit in a light or heavy syrup. Fried fruit. Fruit in cream or butter sauce. Meat and other protein foods Fatty cuts of meat. Ribs. Fried meat. Bacon. Sausage. Bologna and other processed lunch meats. Salami. Fatback. Hotdogs. Bratwurst. Salted nuts and seeds. Canned beans with added salt. Canned or smoked fish. Whole eggs or egg yolks. Chicken or turkey with skin. Dairy Whole or 2% milk, cream, and half-and-half. Whole or full-fat cream cheese. Whole-fat or sweetened yogurt. Full-fat cheese. Nondairy creamers. Whipped toppings.  Processed cheese and cheese spreads. Fats and oils Butter. Stick margarine. Lard. Shortening. Ghee. Bacon fat. Tropical oils, such as coconut, palm kernel, or palm oil. Seasoning and other foods Salted popcorn and pretzels. Onion salt, garlic salt, seasoned salt, table salt, and sea salt. Worcestershire sauce. Tartar sauce. Barbecue sauce. Teriyaki sauce. Soy sauce, including reduced-sodium. Steak sauce. Canned and packaged gravies. Fish sauce. Oyster sauce. Cocktail sauce. Horseradish that you find on the shelf. Ketchup. Mustard. Meat flavorings and tenderizers. Bouillon cubes. Hot sauce and Tabasco sauce. Premade or packaged marinades. Premade or packaged taco seasonings. Relishes. Regular salad dressings. Where to find more information:  National Heart, Lung, and Blood Institute: www.nhlbi.nih.gov  American Heart Association: www.heart.org Summary  The DASH eating plan is a healthy eating plan that has been shown to reduce high blood pressure (hypertension). It may also reduce your risk for type 2 diabetes, heart disease, and stroke.  With the DASH eating plan, you should limit salt (sodium) intake to 2,300 mg a day. If you have hypertension, you may need to reduce your sodium intake to 1,500 mg a day.  When on the DASH eating plan, aim to eat more fresh fruits and vegetables, whole grains, lean proteins, low-fat dairy, and heart-healthy fats.  Work with your health care provider or diet and nutrition specialist (dietitian) to adjust your eating plan to your individual   calorie needs. This information is not intended to replace advice given to you by your health care provider. Make sure you discuss any questions you have with your health care provider. Document Released: 08/24/2011 Document Revised: 08/28/2016 Document Reviewed: 08/28/2016 Elsevier Interactive Patient Education  2018 [employer].  

## 2017-09-27 LAB — LIPID PANEL
Cholesterol: 137 mg/dL (ref <200)
HDL: 40 mg/dL — AB (ref >40)
LDL Cholesterol (Calc): 73 mg/dL (calc)
NON-HDL CHOLESTEROL (CALC): 97 mg/dL (ref <130)
Total CHOL/HDL Ratio: 3.4 (calc) (ref <5.0)
Triglycerides: 158 mg/dL — ABNORMAL HIGH (ref <150)

## 2017-09-27 LAB — COMPLETE METABOLIC PANEL WITH GFR
AG Ratio: 1.5 (calc) (ref 1.0–2.5)
ALBUMIN MSPROF: 4.4 g/dL (ref 3.6–5.1)
ALT: 74 U/L — AB (ref 9–46)
AST: 60 U/L — ABNORMAL HIGH (ref 10–40)
Alkaline phosphatase (APISO): 114 U/L (ref 40–115)
BILIRUBIN TOTAL: 0.5 mg/dL (ref 0.2–1.2)
BUN: 18 mg/dL (ref 7–25)
CHLORIDE: 101 mmol/L (ref 98–110)
CO2: 30 mmol/L (ref 20–32)
Calcium: 9.5 mg/dL (ref 8.6–10.3)
Creat: 0.99 mg/dL (ref 0.60–1.35)
GFR, Est African American: 120 mL/min/{1.73_m2} (ref >=60)
GFR, Est Non African American: 104 mL/min/{1.73_m2} (ref >=60)
GLUCOSE: 105 mg/dL — AB (ref 65–99)
Globulin: 3 g/dL (calc) (ref 1.9–3.7)
Potassium: 4.2 mmol/L (ref 3.5–5.3)
Sodium: 138 mmol/L (ref 135–146)
Total Protein: 7.4 g/dL (ref 6.1–8.1)

## 2017-09-27 LAB — CBC WITH DIFFERENTIAL/PLATELET
BASOS ABS: 43 {cells}/uL (ref 0–200)
Basophils Relative: 0.6 %
Eosinophils Absolute: 216 cells/uL (ref 15–500)
Eosinophils Relative: 3 %
HCT: 44.8 % (ref 38.5–50.0)
Hemoglobin: 15.5 g/dL (ref 13.2–17.1)
Lymphs Abs: 2268 cells/uL (ref 850–3900)
MCH: 28.3 pg (ref 27.0–33.0)
MCHC: 34.6 g/dL (ref 32.0–36.0)
MCV: 81.8 fL (ref 80.0–100.0)
MPV: 8.9 fL (ref 7.5–12.5)
Monocytes Relative: 9 %
Neutro Abs: 4025 cells/uL (ref 1500–7800)
Neutrophils Relative %: 55.9 %
PLATELETS: 349 10*3/uL (ref 140–400)
RBC: 5.48 10*6/uL (ref 4.20–5.80)
RDW: 12.6 % (ref 11.0–15.0)
TOTAL LYMPHOCYTE: 31.5 %
WBC: 7.2 10*3/uL (ref 3.8–10.8)
WBCMIX: 648 {cells}/uL (ref 200–950)

## 2017-09-27 LAB — HIV ANTIBODY (ROUTINE TESTING W REFLEX): HIV 1&2 Ab, 4th Generation: NONREACTIVE

## 2017-09-27 LAB — RPR: RPR Ser Ql: NONREACTIVE

## 2017-09-27 LAB — HEMOGLOBIN A1C
Hgb A1c MFr Bld: 5.1 % of total Hgb (ref <5.7)
Mean Plasma Glucose: 100 (calc)
eAG (mmol/L): 5.5 (calc)

## 2017-09-27 LAB — TSH: TSH: 1.06 mIU/L (ref 0.40–4.50)

## 2017-09-28 ENCOUNTER — Ambulatory Visit: Payer: 59

## 2017-12-24 ENCOUNTER — Ambulatory Visit: Payer: 59 | Admitting: Family Medicine

## 2018-03-13 ENCOUNTER — Ambulatory Visit (INDEPENDENT_AMBULATORY_CARE_PROVIDER_SITE_OTHER): Payer: Self-pay | Admitting: Nurse Practitioner

## 2018-03-13 ENCOUNTER — Encounter: Payer: Self-pay | Admitting: Nurse Practitioner

## 2018-03-13 VITALS — BP 132/74 | HR 100 | Temp 98.2°F | Resp 18 | Ht 72.0 in | Wt 237.5 lb

## 2018-03-13 DIAGNOSIS — Z23 Encounter for immunization: Secondary | ICD-10-CM

## 2018-03-13 DIAGNOSIS — Z789 Other specified health status: Secondary | ICD-10-CM

## 2018-03-13 DIAGNOSIS — Z020 Encounter for examination for admission to educational institution: Secondary | ICD-10-CM

## 2018-03-13 NOTE — Progress Notes (Addendum)
Name: Mason Parker   MRN: [id number]    DOB: May 11, 1991   Date:03/13/2018       Progress Note  Subjective  Chief Complaint  Chief Complaint  Patient presents with  . Immunizations    for college    HPI  Pt states highschool has lost his records and cannot get proof of immunizations of tetanus and MMR. Patient and mom state he has completed MMR series and has had multiple TDAPs. One from 2012 in immunization registry. Pt sts no longer on any medication and would not like to be on any. He has been using cognitive behavioral techniques to assist with anxiety and public speaking fear and has changes been drinking lots of water and exercising and eating healthy to get his blood pressure down.  BP Readings from Last 3 Encounters:  03/13/18 132/74  09/24/17 (!) 142/78  09/14/17 (!) 144/100    Wt Readings from Last 3 Encounters:  03/13/18 237 lb 8 oz (107.7 kg)  09/24/17 241 lb 6.4 oz (109.5 kg)  09/14/17 239 lb 1.6 oz (108.5 kg)      Patient Active Problem List   Diagnosis Date Noted  . Elevated blood-pressure reading without diagnosis of hypertension 03/12/2017  . 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 Recurrent dislocation of right shoulder 07/16/2015  . Verruca warts (infectious) 06/03/2015  . Anxiety, generalized 02/25/2015  . Insomnia 02/25/2015  . Gastro-esophageal reflux disease without esophagitis 02/25/2015  . Chronic recurrent major depressive disorder ([place]) 02/25/2015  . Obesity (BMI 30.0-34.9) 02/25/2015  . 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 Perennial allergic rhinitis with seasonal variation 02/25/2015  . Learning disability 02/25/2015    Past Medical History:  Diagnosis Date  . Allergy   . Anxiety   . Dysrhythmia    PALPITAIONS ASSOCIATED WITH ANXIETY ATTACKS ONLY  . GERD (gastroesophageal reflux disease)    NO MEDS-PT AVOIDS SPICY/ACIDIC FOODS AND NOW HAS NO PROBLEMS  . Headache    MIGRAINES  . Insomnia   . Learning  disability   . Obesity (BMI 30.0-34.9) 02/25/2015  . Personal history of dislocation of shoulder   . Right hand paresthesia   . Skin lesion   . Tic disorder   . Viral wart     Past Surgical History:  Procedure Laterality Date  . CIRCUMCISION  1991  . SHOULDER ARTHROSCOPY Right 09/09/2015   Procedure: ARTHROSCOPY SHOULDER, RIGHT SHOULDER ATHROSCOPIC ANTERIOR AND POSTERIOR LABRAL TEAR REPAIR, CAPSULORRHAPHY;  Surgeon: Thornton Park, MD;  Location: [facility] ORS;  Service: Orthopedics;  Laterality: Right;    Social History   Tobacco Use  . Smoking status: Never Smoker  . Smokeless tobacco: Never Used  Substance Use Topics  . Alcohol use: No    Alcohol/week: 0.0 oz    No current outpatient medications on file.  Allergies  Allergen Reactions  . Other Swelling    YOGURT  . Shellfish Allergy Hives    ROS   No other specific complaints in a complete review of systems (except as listed in HPI above).  Objective  Vitals:   03/13/18 1436  BP: 132/74  Pulse: 100  Resp: 18  Temp: 98.2 F (36.8 C)  TempSrc: Oral  SpO2: 97%  Weight: 237 lb 8 oz (107.7 kg)  Height: 6' (1.829 m)    Body mass index is 32.21 kg/m.  Nursing Note and Vital Signs reviewed.  Physical Exam  Constitutional: Patient appears well-developed and well-nourished.  No distress.  Cardiovascular: skin warm and dry, pink, heart  rate mildly elevated  Pulmonary/Chest: Effort normal  Psychiatric: Patient has a normal mood and affect. behavior is normal. Judgment and thought content normal.  No results found for this or any previous visit (from the past 72 hour(s)).  Assessment & Plan  1. School health examination Discussed healthy living in school, recommended meningococcal vaccine, patient and mother declined due to finances. He is working on getting on [employer] and can get it then. Discussed recommendation for goal BP under 130/80 and recommend atenolol- pt and mother declines stating he checks  BP at home and well maintained- will call in some BP or return if consistently over 130/80.   2. Need for tetanus booster - Tdap vaccine greater than or equal to 7yo IM  3. History of measles, mumps, rubella (MMR) vaccination unknown - Measles/Mumps/Rubella Immunity  I have reviewed this encounter including the documentation in this note and/or discussed this patient with the provider, Suezanne Cheshire DNP AGNP-C. I am certifying that I agree with the content of this note as supervising physician. Steele Sizer, MD [facility] 03/13/2018, 6:39 PM

## 2018-03-13 NOTE — Patient Instructions (Signed)
DASH Eating Plan DASH stands for "Dietary Approaches to Stop Hypertension." The DASH eating plan is a healthy eating plan that has been shown to reduce high blood pressure (hypertension). It may also reduce your risk for type 2 diabetes, heart disease, and stroke. The DASH eating plan may also help with weight loss. What are tips for following this plan? General guidelines  Avoid eating more than 2,300 mg (milligrams) of salt (sodium) a day. If you have hypertension, you may need to reduce your sodium intake to 1,500 mg a day.  Limit alcohol intake to no more than 1 drink a day for nonpregnant women and 2 drinks a day for men. One drink equals 12 oz of beer, 5 oz of wine, or 1 oz of hard liquor.  Work with your health care provider to maintain a healthy body weight or to lose weight. Ask what an ideal weight is for you.  Get at least 30 minutes of exercise that causes your heart to beat faster (aerobic exercise) most days of the week. Activities may include walking, swimming, or biking.  Work with your health care provider or diet and nutrition specialist (dietitian) to adjust your eating plan to your individual calorie needs. Reading food labels  Check food labels for the amount of sodium per serving. Choose foods with less than 5 percent of the Daily Value of sodium. Generally, foods with less than 300 mg of sodium per serving fit into this eating plan.  To find whole grains, look for the word "whole" as the first word in the ingredient list. Shopping  Buy products labeled as "low-sodium" or "no salt added."  Buy fresh foods. Avoid canned foods and premade or frozen meals. Cooking  Avoid adding salt when cooking. Use salt-free seasonings or herbs instead of table salt or sea salt. Check with your health care provider or pharmacist before using salt substitutes.  Do not fry foods. Cook foods using healthy methods such as baking, boiling, grilling, and broiling instead.  Cook with  heart-healthy oils, such as olive, canola, soybean, or sunflower oil. Meal planning   Eat a balanced diet that includes: ? 5 or more servings of fruits and vegetables each day. At each meal, try to fill half of your plate with fruits and vegetables. ? Up to 6-8 servings of whole grains each day. ? Less than 6 oz of lean meat, poultry, or fish each day. A 3-oz serving of meat is about the same size as a deck of cards. One egg equals 1 oz. ? 2 servings of low-fat dairy each day. ? A serving of nuts, seeds, or beans 5 times each week. ? Heart-healthy fats. Healthy fats called Omega-3 fatty acids are found in foods such as flaxseeds and coldwater fish, like sardines, salmon, and mackerel.  Limit how much you eat of the following: ? Canned or prepackaged foods. ? Food that is high in trans fat, such as fried foods. ? Food that is high in saturated fat, such as fatty meat. ? Sweets, desserts, sugary drinks, and other foods with added sugar. ? Full-fat dairy products.  Do not salt foods before eating.  Try to eat at least 2 vegetarian meals each week.  Eat more home-cooked food and less restaurant, buffet, and fast food.  When eating at a restaurant, ask that your food be prepared with less salt or no salt, if possible. What foods are recommended? The items listed may not be a complete list. Talk with your dietitian about what   dietary choices are best for you. Grains Whole-grain or whole-wheat bread. Whole-grain or whole-wheat pasta. Brown rice. Oatmeal. Quinoa. Bulgur. Whole-grain and low-sodium cereals. Pita bread. Low-fat, low-sodium crackers. Whole-wheat flour tortillas. Vegetables Fresh or frozen vegetables (raw, steamed, roasted, or grilled). Low-sodium or reduced-sodium tomato and vegetable juice. Low-sodium or reduced-sodium tomato sauce and tomato paste. Low-sodium or reduced-sodium canned vegetables. Fruits All fresh, dried, or frozen fruit. Canned fruit in natural juice (without  added sugar). Meat and other protein foods Skinless chicken or turkey. Ground chicken or turkey. Pork with fat trimmed off. Fish and seafood. Egg whites. Dried beans, peas, or lentils. Unsalted nuts, nut butters, and seeds. Unsalted canned beans. Lean cuts of beef with fat trimmed off. Low-sodium, lean deli meat. Dairy Low-fat (1%) or fat-free (skim) milk. Fat-free, low-fat, or reduced-fat cheeses. Nonfat, low-sodium ricotta or cottage cheese. Low-fat or nonfat yogurt. Low-fat, low-sodium cheese. Fats and oils Soft margarine without trans fats. Vegetable oil. Low-fat, reduced-fat, or light mayonnaise and salad dressings (reduced-sodium). Canola, safflower, olive, soybean, and sunflower oils. Avocado. Seasoning and other foods Herbs. Spices. Seasoning mixes without salt. Unsalted popcorn and pretzels. Fat-free sweets. What foods are not recommended? The items listed may not be a complete list. Talk with your dietitian about what dietary choices are best for you. Grains Baked goods made with fat, such as croissants, muffins, or some breads. Dry pasta or rice meal packs. Vegetables Creamed or fried vegetables. Vegetables in a cheese sauce. Regular canned vegetables (not low-sodium or reduced-sodium). Regular canned tomato sauce and paste (not low-sodium or reduced-sodium). Regular tomato and vegetable juice (not low-sodium or reduced-sodium). Pickles. Olives. Fruits Canned fruit in a light or heavy syrup. Fried fruit. Fruit in cream or butter sauce. Meat and other protein foods Fatty cuts of meat. Ribs. Fried meat. Bacon. Sausage. Bologna and other processed lunch meats. Salami. Fatback. Hotdogs. Bratwurst. Salted nuts and seeds. Canned beans with added salt. Canned or smoked fish. Whole eggs or egg yolks. Chicken or turkey with skin. Dairy Whole or 2% milk, cream, and half-and-half. Whole or full-fat cream cheese. Whole-fat or sweetened yogurt. Full-fat cheese. Nondairy creamers. Whipped toppings.  Processed cheese and cheese spreads. Fats and oils Butter. Stick margarine. Lard. Shortening. Ghee. Bacon fat. Tropical oils, such as coconut, palm kernel, or palm oil. Seasoning and other foods Salted popcorn and pretzels. Onion salt, garlic salt, seasoned salt, table salt, and sea salt. Worcestershire sauce. Tartar sauce. Barbecue sauce. Teriyaki sauce. Soy sauce, including reduced-sodium. Steak sauce. Canned and packaged gravies. Fish sauce. Oyster sauce. Cocktail sauce. Horseradish that you find on the shelf. Ketchup. Mustard. Meat flavorings and tenderizers. Bouillon cubes. Hot sauce and Tabasco sauce. Premade or packaged marinades. Premade or packaged taco seasonings. Relishes. Regular salad dressings. Where to find more information:  National Heart, Lung, and Blood Institute: www.nhlbi.nih.gov  American Heart Association: www.heart.org Summary  The DASH eating plan is a healthy eating plan that has been shown to reduce high blood pressure (hypertension). It may also reduce your risk for type 2 diabetes, heart disease, and stroke.  With the DASH eating plan, you should limit salt (sodium) intake to 2,300 mg a day. If you have hypertension, you may need to reduce your sodium intake to 1,500 mg a day.  When on the DASH eating plan, aim to eat more fresh fruits and vegetables, whole grains, lean proteins, low-fat dairy, and heart-healthy fats.  Work with your health care provider or diet and nutrition specialist (dietitian) to adjust your eating plan to your individual   calorie needs. This information is not intended to replace advice given to you by your health care provider. Make sure you discuss any questions you have with your health care provider. Document Released: 08/24/2011 Document Revised: 08/28/2016 Document Reviewed: 08/28/2016 Elsevier Interactive Patient Education  2018 [employer].  

## 2018-03-14 LAB — MEASLES/MUMPS/RUBELLA IMMUNITY
Rubella: 3.94 index
Rubeola IgG: 42.3 AU/mL

## 2018-04-02 ENCOUNTER — Telehealth: Payer: Self-pay

## 2018-04-02 NOTE — Telephone Encounter (Signed)
Copied from CRM [phone]. Topic: General - Other >> Apr 02, 2018  3:22 PM Terisa Starraylor, Brittany L wrote: Reason for CRM: Patient's father called and said that his immunizations record was [facility]. They said they did not receive it. Fax number [phone]. He said if he needs to come pick it up then he will because they have a hold on his registration until they get it back. [phone]

## 2018-04-04 NOTE — Telephone Encounter (Signed)
Patient  notified to come by office for injection. Labs showed no immunity with MMR

## 2018-08-04 IMAGING — CR DG CERVICAL SPINE COMPLETE 4+V
1 series · 5 of 5 positions shown · non-contrast
Comparison: None.

CLINICAL DATA: Acute neck pain after recent strain injury.

EXAM:
CERVICAL SPINE - COMPLETE 4+ VIEW

[Series 1: dg cervical spine complete · 0.14mm/px · 5 of 5 slices shown]
[im 1/5]
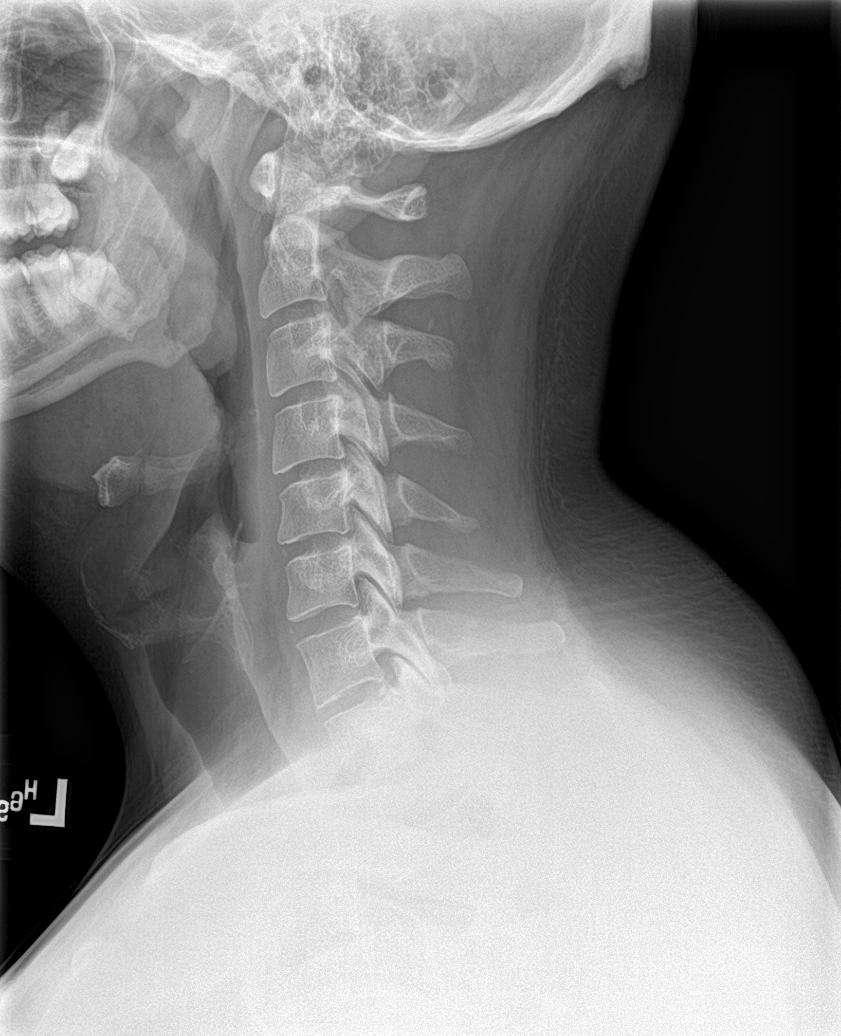
[im 2/5]
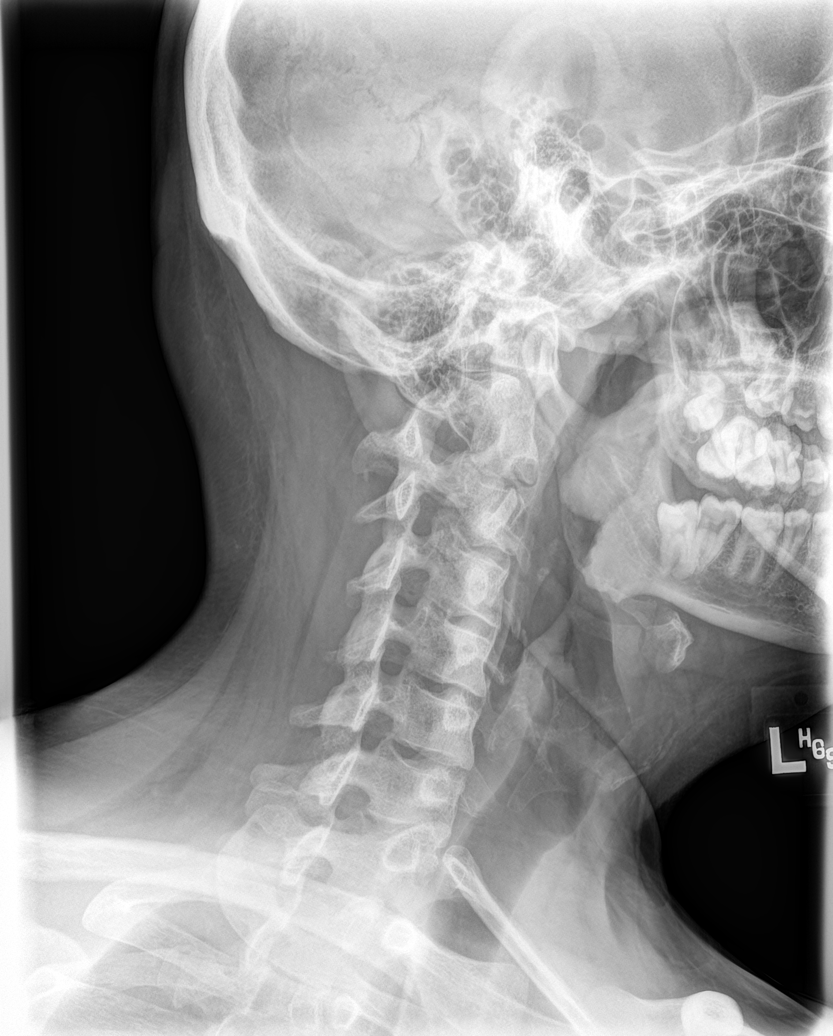
[im 3/5]
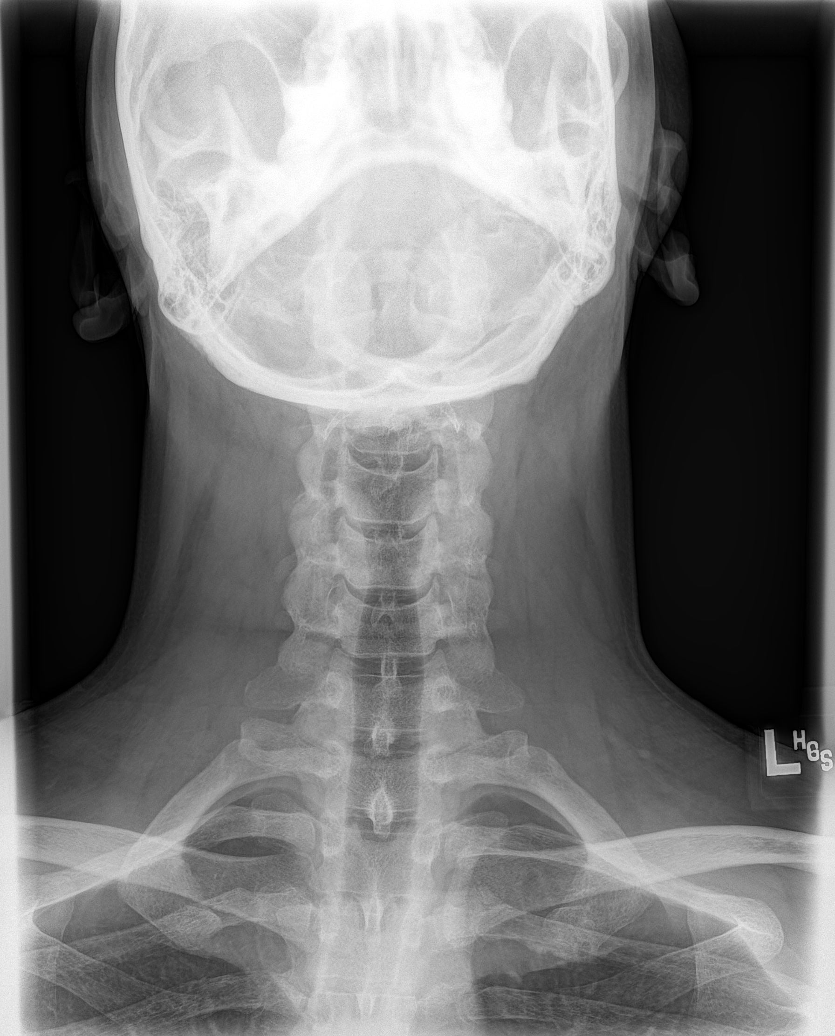
[im 4/5]
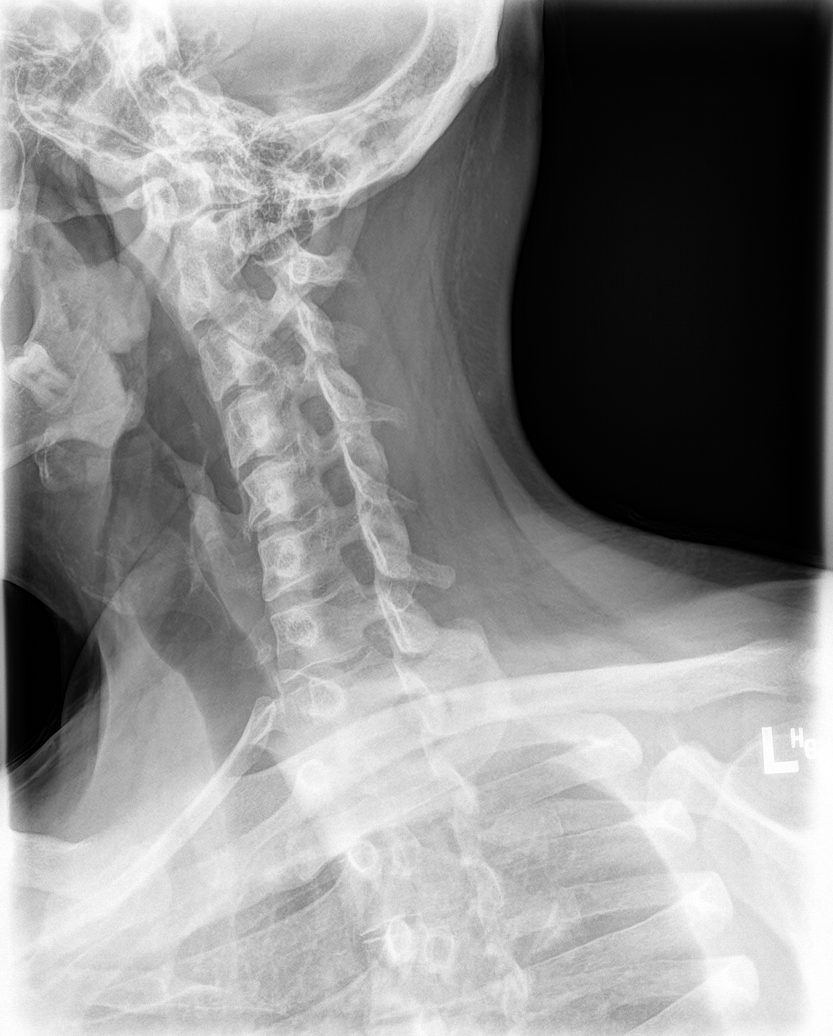
[im 5/5]
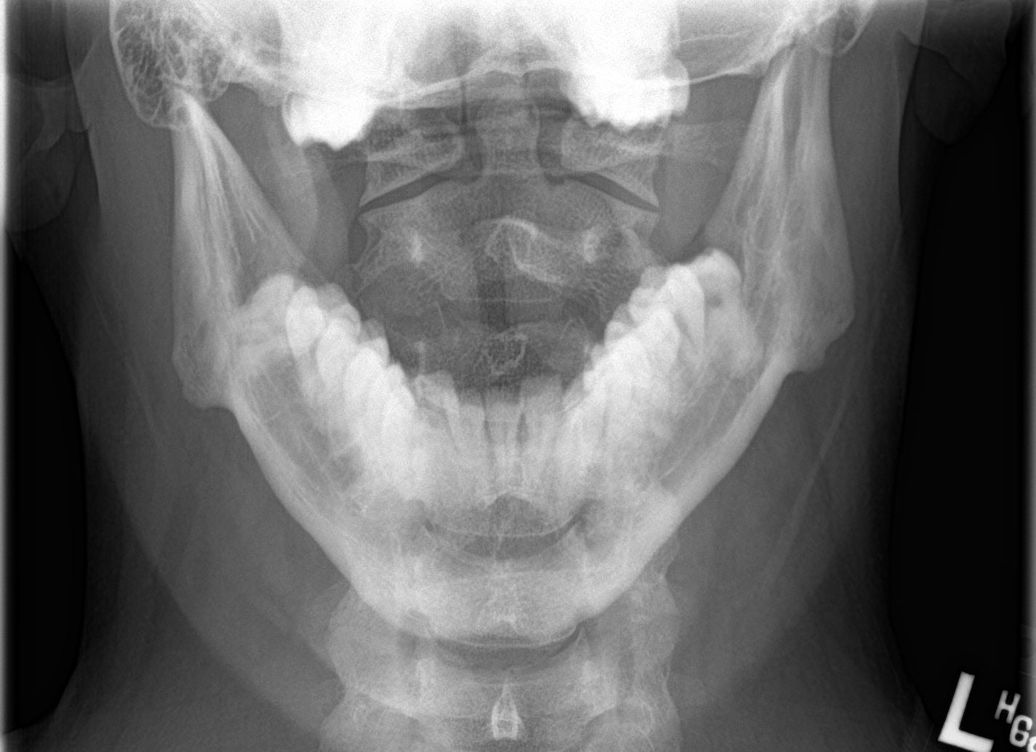

[5 of 5 positions shown; findings below may reference images not displayed]

FINDINGS: There is no evidence of cervical spine fracture or prevertebral soft
tissue swelling. Alignment is normal. No other significant bone
abnormalities are identified.
IMPRESSION: Negative cervical spine radiographs.

## 2020-02-24 ENCOUNTER — Encounter: Payer: Self-pay | Admitting: Family Medicine

## 2020-02-24 ENCOUNTER — Other Ambulatory Visit: Payer: Self-pay

## 2020-02-24 ENCOUNTER — Ambulatory Visit (INDEPENDENT_AMBULATORY_CARE_PROVIDER_SITE_OTHER): Payer: PRIVATE HEALTH INSURANCE | Admitting: Family Medicine

## 2020-02-24 VITALS — BP 162/98 | HR 74 | Temp 97.3°F | Resp 18 | Ht 72.0 in | Wt 227.1 lb

## 2020-02-24 DIAGNOSIS — F819 Developmental disorder of scholastic skills, unspecified: Secondary | ICD-10-CM | POA: Diagnosis not present

## 2020-02-24 DIAGNOSIS — Z131 Encounter for screening for diabetes mellitus: Secondary | ICD-10-CM

## 2020-02-24 DIAGNOSIS — F339 Major depressive disorder, recurrent, unspecified: Secondary | ICD-10-CM | POA: Diagnosis not present

## 2020-02-24 DIAGNOSIS — E66811 Obesity, class 1: Secondary | ICD-10-CM

## 2020-02-24 DIAGNOSIS — F411 Generalized anxiety disorder: Secondary | ICD-10-CM

## 2020-02-24 DIAGNOSIS — E669 Obesity, unspecified: Secondary | ICD-10-CM

## 2020-02-24 DIAGNOSIS — Z1159 Encounter for screening for other viral diseases: Secondary | ICD-10-CM

## 2020-02-24 DIAGNOSIS — R7989 Other specified abnormal findings of blood chemistry: Secondary | ICD-10-CM

## 2020-02-24 DIAGNOSIS — Z13220 Encounter for screening for lipoid disorders: Secondary | ICD-10-CM

## 2020-02-24 DIAGNOSIS — I10 Essential (primary) hypertension: Secondary | ICD-10-CM

## 2020-02-24 MED ORDER — VITAMIN D 50 MCG (2000 UT) PO CAPS: 1.0000 | ORAL_CAPSULE | Freq: Every day | ORAL | 0 refills | Status: DC

## 2020-02-24 MED ORDER — ATENOLOL 25 MG PO TABS: 25.0000 mg | ORAL_TABLET | Freq: Every day | ORAL | 0 refills | Status: DC

## 2020-02-24 MED ORDER — ESCITALOPRAM OXALATE 10 MG PO TABS: 10.0000 mg | ORAL_TABLET | Freq: Every day | ORAL | 0 refills | Status: DC

## 2020-02-24 MED ORDER — HYDROXYZINE HCL 10 MG PO TABS: 10.0000 mg | ORAL_TABLET | Freq: Three times a day (TID) | ORAL | 0 refills | Status: DC | PRN

## 2020-02-24 NOTE — Progress Notes (Signed)
Name: Mason Parker   MRN: [id number]    DOB: 1990/11/19   Date:02/24/2020       Progress Note  Subjective  Chief Complaint  Chief Complaint  Patient presents with  . Hypertension  . Anxiety  . Gastroesophageal Reflux  . Depression  . 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 He states more active, no change in diet   Major Depression and GAD: .He was doing well on medications, however father was laid off on May 31st, , he has been out of medication for a long time. Working full time, doing well at his job, but feels targeted by other employees because of his personal success. He would like to resume medications. Discussed options and we will try Lexapro and prn hydroxizine, avoid taking at work at first because it may cause sedation. He is having difficulty sleeping, try hydroxizine   Learning Disability: not affecting him much at work, but worried about going back into training, but he states he will take notes   Dyslipidemia: low HDL, we will recheck labs.   GERD: under control at this time, unless if he eats right before going to bed.   AR: he has been taking otc medications, he has rhinorrhea, nasal congestion, occasional sneezing and itchy eyes. He asked about allergy testing but explained we can treat him without allergy testing.     Patient Active Problem List   Diagnosis Date Noted  . Elevated blood-pressure reading without diagnosis of hypertension 03/12/2017  . 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 Recurrent dislocation of right shoulder 07/16/2015  . Verruca warts (infectious) 06/03/2015  . Anxiety, generalized 02/25/2015  . Insomnia 02/25/2015  . Gastro-esophageal reflux disease without esophagitis 02/25/2015  . Chronic recurrent major depressive disorder ([place]) 02/25/2015  . Obesity (BMI 30.0-34.9) 02/25/2015  . 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  Perennial allergic rhinitis with seasonal variation 02/25/2015  . Learning disability 02/25/2015    Past Surgical History:  Procedure Laterality Date  . CIRCUMCISION  1991  . SHOULDER ARTHROSCOPY Right 09/09/2015   Procedure: ARTHROSCOPY SHOULDER, RIGHT SHOULDER ATHROSCOPIC ANTERIOR AND POSTERIOR LABRAL TEAR REPAIR, CAPSULORRHAPHY;  Surgeon: Thornton Park, MD;  Location: [facility] ORS;  Service: Orthopedics;  Laterality: Right;    Family History  Problem Relation Age of Onset  . Bipolar disorder Sister   . Depression Brother   . Hypertension Mother   . Heart disease Mother   . Depression Mother   . Hypertension Father   . Bipolar disorder Father   . Hyperlipidemia Father   . Obesity Father     Social History   Tobacco Use  . Smoking status: Never Smoker  . Smokeless tobacco: Never Used  Substance Use Topics  . Alcohol use: No    Alcohol/week: 0.0 standard drinks     Current Outpatient Medications:  Marland Kitchen  Melatonin 5 MG CAPS, Take by mouth., Disp: , Rfl:  .  atenolol (TENORMIN) 25 MG tablet, Take 1 tablet (25 mg total) by mouth daily., Disp: 90 tablet, Rfl: 0 .  Cholecalciferol (VITAMIN D) 50 MCG (2000 UT) CAPS, Take 1 capsule (2,000 Units total) by mouth daily., Disp: 30 capsule, Rfl: 0 .  escitalopram (LEXAPRO) 10 MG tablet, Take 1 tablet (10 mg total) by mouth daily., Disp: 30 tablet, Rfl: 0 .  hydrOXYzine (ATARAX/VISTARIL) 10 MG tablet, Take 1 tablet (10 mg total) by mouth 3 (  three) times daily as needed. It may make you feel sleepy, Disp: 30 tablet, Rfl: 0  Allergies  Allergen Reactions  . Other Swelling    YOGURT  . Shellfish Allergy Hives    I personally reviewed active problem list, medication list, allergies, family history, social history, health maintenance with the patient/caregiver today.   ROS  Constitutional: Negative for fever , positive for weight change.  Respiratory: Negative for cough and shortness of breath.   Cardiovascular: Negative for chest  pain or palpitations.  Gastrointestinal: Negative for abdominal pain, no bowel changes.  Musculoskeletal: Negative for gait problem or joint swelling.  Skin: Negative for rash.  Neurological: Negative for dizziness or headache.  No other specific complaints in a complete review of systems (except as listed in HPI above).   Objective  Vitals:   02/24/20 0859 02/24/20 1009  BP: (!) 168/100 (!) 162/98  Pulse: 74   Resp: 18   Temp: (!) 97.3 F (36.3 C)   TempSrc: Temporal   SpO2: 95%   Weight: 227 lb 1.6 oz (103 kg)   Height: 6' (1.829 m)     Body mass index is 30.8 kg/m.  Physical Exam  Constitutional: Patient appears well-developed and well-nourished. Obese  No distress.  HEENT: head atraumatic, normocephalic, pupils equal and reactive to light, neck supple Cardiovascular: Normal rate, regular rhythm and normal heart sounds.  No murmur heard. No BLE edema. Pulmonary/Chest: Effort normal and breath sounds normal. No respiratory distress. Abdominal: Soft.  There is no tenderness. Psychiatric: Patient has a normal mood and affect. behavior is normal. Judgment and thought content normal.  PHQ2/9: Depression screen [facility] 2/9 02/24/2020 03/13/2018 09/24/2017 09/14/2017 03/12/2017  Decreased Interest 2 0 1 3 3   Down, Depressed, Hopeless 1 0 1 2 3   PHQ - 2 Score 3 0 2 5 6   Altered sleeping 3 1 3 3 3   Tired, decreased energy 3 1 3 3 2   Change in appetite 0 0 2 3 0  Feeling bad or failure about yourself  1 1 1 3 3   Trouble concentrating 1 1 0 0 1  Moving slowly or fidgety/restless 2 0 0 3 1  Suicidal thoughts 0 0 0 3 0  PHQ-9 Score 13 4 11 23 16   Difficult doing work/chores Somewhat difficult Not difficult at all Somewhat difficult Somewhat difficult Somewhat difficult  Some recent data might be hidden    phq 9 is positive   GAD 7 : Generalized Anxiety Score 02/24/2020  Nervous, Anxious, on Edge 2  Control/stop worrying 0  Worry too much - different things 1  Trouble relaxing 3   Restless 0  Easily annoyed or irritable 1  Afraid - awful might happen 0  Total GAD 7 Score 7  Anxiety Difficulty Somewhat difficult     Fall Risk: Fall Risk  02/24/2020 03/13/2018 09/14/2017 03/12/2017 07/11/2016  Falls in the past year? 0 No No No No  Number falls in past yr: 0 - - - -  Injury with Fall? 0 - - - -  Follow up Falls evaluation completed - - - -     Assessment & Plan  1. Anxiety, generalized  - escitalopram (LEXAPRO) 10 MG tablet; Take 1 tablet (10 mg total) by mouth daily.  Dispense: 30 tablet; Refill: 0 - hydrOXYzine (ATARAX/VISTARIL) 10 MG tablet; Take 1 tablet (10 mg total) by mouth 3 (three) times daily as needed. It may make you feel sleepy  Dispense: 30 tablet; Refill: 0  2. Chronic recurrent  major depressive disorder (HCC)  - escitalopram (LEXAPRO) 10 MG tablet; Take 1 tablet (10 mg total) by mouth daily.  Dispense: 30 tablet; Refill: 0  3. Obesity (BMI 30.0-34.9)  Discussed with the patient the risk posed by an increased BMI. Discussed importance of portion control, calorie counting and at least 150 minutes of physical activity weekly. Avoid sweet beverages and drink more water. Eat at least 6 servings of fruit and vegetables daily   4. Learning disability   5. Uncontrolled hypertension  - Microalbumin / creatinine urine ratio - COMPLETE METABOLIC PANEL WITH GFR - CBC with Differential/Platelet - TSH + free T4 - atenolol (TENORMIN) 25 MG tablet; Take 1 tablet (25 mg total) by mouth daily.  Dispense: 90 tablet; Refill: 0  6. Lipid screening  - Lipid panel  7. Diabetes mellitus screening  - Hemoglobin A1c  8. Need for hepatitis C screening test  - Hepatitis C antibody  9. Low vitamin D level  - Cholecalciferol (VITAMIN D) 50 MCG (2000 UT) CAPS; Take 1 capsule (2,000 Units total) by mouth daily.  Dispense: 30 capsule; Refill: 0

## 2020-02-25 LAB — HEMOGLOBIN A1C
Hgb A1c MFr Bld: 4.8 % of total Hgb (ref <5.7)
Mean Plasma Glucose: 91 (calc)
eAG (mmol/L): 5 (calc)

## 2020-02-25 LAB — CBC WITH DIFFERENTIAL/PLATELET
Absolute Monocytes: 1040 cells/uL — ABNORMAL HIGH (ref 200–950)
Basophils Absolute: 71 cells/uL (ref 0–200)
Basophils Relative: 0.7 %
Eosinophils Absolute: 245 cells/uL (ref 15–500)
Eosinophils Relative: 2.4 %
HCT: 45.6 % (ref 38.5–50.0)
Hemoglobin: 15.5 g/dL (ref 13.2–17.1)
Lymphs Abs: 2856 cells/uL (ref 850–3900)
MCH: 29.1 pg (ref 27.0–33.0)
MCHC: 34 g/dL (ref 32.0–36.0)
MCV: 85.7 fL (ref 80.0–100.0)
MPV: 9 fL (ref 7.5–12.5)
Monocytes Relative: 10.2 %
Neutro Abs: 5987 cells/uL (ref 1500–7800)
Neutrophils Relative %: 58.7 %
Platelets: 365 10*3/uL (ref 140–400)
RBC: 5.32 10*6/uL (ref 4.20–5.80)
RDW: 12.2 % (ref 11.0–15.0)
Total Lymphocyte: 28 %
WBC: 10.2 10*3/uL (ref 3.8–10.8)

## 2020-02-25 LAB — HEPATITIS C ANTIBODY
Hepatitis C Ab: NONREACTIVE
SIGNAL TO CUT-OFF: 0 (ref <1.00)

## 2020-02-25 LAB — COMPLETE METABOLIC PANEL WITH GFR
AG Ratio: 1.8 (calc) (ref 1.0–2.5)
ALT: 28 U/L (ref 9–46)
AST: 16 U/L (ref 10–40)
Albumin: 4.6 g/dL (ref 3.6–5.1)
Alkaline phosphatase (APISO): 98 U/L (ref 36–130)
BUN: 23 mg/dL (ref 7–25)
CO2: 26 mmol/L (ref 20–32)
Calcium: 9.5 mg/dL (ref 8.6–10.3)
Chloride: 102 mmol/L (ref 98–110)
Creat: 1.34 mg/dL (ref 0.60–1.35)
GFR, Est African American: 82 mL/min/{1.73_m2} (ref >=60)
GFR, Est Non African American: 71 mL/min/{1.73_m2} (ref >=60)
Globulin: 2.6 g/dL (calc) (ref 1.9–3.7)
Glucose, Bld: 98 mg/dL (ref 65–99)
Potassium: 3.5 mmol/L (ref 3.5–5.3)
Sodium: 139 mmol/L (ref 135–146)
Total Bilirubin: 0.5 mg/dL (ref 0.2–1.2)
Total Protein: 7.2 g/dL (ref 6.1–8.1)

## 2020-02-25 LAB — LIPID PANEL
Cholesterol: 135 mg/dL (ref <200)
HDL: 53 mg/dL (ref >=40)
LDL Cholesterol (Calc): 66 mg/dL (calc)
Non-HDL Cholesterol (Calc): 82 mg/dL (calc) (ref <130)
Total CHOL/HDL Ratio: 2.5 (calc) (ref <5.0)
Triglycerides: 84 mg/dL (ref <150)

## 2020-02-25 LAB — MICROALBUMIN / CREATININE URINE RATIO
Creatinine, Urine: 102 mg/dL (ref 20–320)
Microalb Creat Ratio: 50 mcg/mg creat — ABNORMAL HIGH (ref <30)
Microalb, Ur: 5.1 mg/dL

## 2020-02-25 LAB — TSH+FREE T4: TSH W/REFLEX TO FT4: 1.48 mIU/L (ref 0.40–4.50)

## 2020-04-08 ENCOUNTER — Other Ambulatory Visit: Payer: Self-pay | Admitting: Family Medicine

## 2020-04-08 DIAGNOSIS — F339 Major depressive disorder, recurrent, unspecified: Secondary | ICD-10-CM

## 2020-04-08 DIAGNOSIS — F411 Generalized anxiety disorder: Secondary | ICD-10-CM

## 2020-05-31 ENCOUNTER — Other Ambulatory Visit: Payer: Self-pay

## 2020-05-31 ENCOUNTER — Ambulatory Visit (INDEPENDENT_AMBULATORY_CARE_PROVIDER_SITE_OTHER): Payer: PRIVATE HEALTH INSURANCE | Admitting: Family Medicine

## 2020-05-31 ENCOUNTER — Encounter: Payer: Self-pay | Admitting: Family Medicine

## 2020-05-31 VITALS — BP 180/130 | HR 102 | Temp 98.0°F | Resp 16 | Ht 72.0 in | Wt 229.2 lb

## 2020-05-31 DIAGNOSIS — Z23 Encounter for immunization: Secondary | ICD-10-CM | POA: Diagnosis not present

## 2020-05-31 DIAGNOSIS — F411 Generalized anxiety disorder: Secondary | ICD-10-CM

## 2020-05-31 DIAGNOSIS — R809 Proteinuria, unspecified: Secondary | ICD-10-CM

## 2020-05-31 DIAGNOSIS — E669 Obesity, unspecified: Secondary | ICD-10-CM

## 2020-05-31 DIAGNOSIS — F339 Major depressive disorder, recurrent, unspecified: Secondary | ICD-10-CM

## 2020-05-31 DIAGNOSIS — I10 Essential (primary) hypertension: Secondary | ICD-10-CM

## 2020-05-31 DIAGNOSIS — R0683 Snoring: Secondary | ICD-10-CM

## 2020-05-31 DIAGNOSIS — I129 Hypertensive chronic kidney disease with stage 1 through stage 4 chronic kidney disease, or unspecified chronic kidney disease: Secondary | ICD-10-CM

## 2020-05-31 DIAGNOSIS — F331 Major depressive disorder, recurrent, moderate: Secondary | ICD-10-CM

## 2020-05-31 DIAGNOSIS — N181 Chronic kidney disease, stage 1: Secondary | ICD-10-CM

## 2020-05-31 DIAGNOSIS — G4700 Insomnia, unspecified: Secondary | ICD-10-CM

## 2020-05-31 MED ORDER — PAROXETINE HCL 10 MG PO TABS: 10.0000 mg | ORAL_TABLET | Freq: Every day | ORAL | 1 refills | Status: DC

## 2020-05-31 MED ORDER — VALSARTAN-HYDROCHLOROTHIAZIDE 160-12.5 MG PO TABS: 1.0000 | ORAL_TABLET | Freq: Every day | ORAL | 0 refills | Status: DC

## 2020-05-31 MED ORDER — QUETIAPINE FUMARATE 25 MG PO TABS: 25.0000 mg | ORAL_TABLET | Freq: Every day | ORAL | 1 refills | Status: DC

## 2020-05-31 MED ORDER — AMLODIPINE BESYLATE 2.5 MG PO TABS: 2.5000 mg | ORAL_TABLET | Freq: Every day | ORAL | 0 refills | Status: DC

## 2020-05-31 NOTE — Patient Instructions (Signed)
Start with amlodipine 2.5 mg before bed for one week, after that take 2 before bed  Valsartan hctz 160/125.mg take half a pill for one week, after that take one daily    Check with pharmacy if combo Exforge hctz is cheaper than paying for two co-pays

## 2020-05-31 NOTE — Progress Notes (Signed)
Name: Mason Parker   MRN: [id number]    DOB: 08/16/1991   Date:05/31/2020       Progress Note  Subjective  Chief Complaint  Chief Complaint  Patient presents with  . Anxiety    He stopped taking Lexapro, because it increased his anxiety. He took medication for two weeks.  . Depression  . Gastroesophageal Reflux  . Hypertension    He stopped taking his bp medication because it only lowered his heart rate and did nothing for his BP.    HPI  Obesity: his weight has been stable, he had lost 2 lbs but gained 2 lbs since last visit . He is eating a lower carbohydrate diet most of the time.He states just celebrated his birthday , also not as active at work.   Snoring:ESS in our office is 3. He did not have a sleep study.   Major Depression and GAD: used to take mediation a long time ago, last visit was June 2021 and we tried Lexapro and hydroxizine, but he states unable to sleep on medication and stopped taking both. He has been under more stress at work, he responded well to paxil in the past and we will resume this medication. He works second shift and get home around 2-3 am, he is able to fall asleep around 6 am but has interrupted sleep, he has difficulty calming down and wakes up feeling tired. We will try adding seroquel at night.   Uncontrolled HTN: positive urine micro, he was on Atenolol but did not help, he initially took for performance anxiety , he stopped medication a couple of months ago. His bp is very high, he has daily headache, no chest pain only has palpitation during a panic attack. He has positive family history of kidney disease and htn, we will be more aggressive with therapy and refer him to nephrologist   GERD: he is doing okay, usually triggered by eating unhealthy or skipping meals. Unchanged   Patient Active Problem List   Diagnosis Date Noted  . Elevated blood-pressure reading without diagnosis of hypertension 03/12/2017  . Aftercare following right  shoulder joint replacement surgery 11/29/2015  . Recurrent dislocation of right shoulder 07/16/2015  . Verruca warts (infectious) 06/03/2015  . Anxiety, generalized 02/25/2015  . Insomnia 02/25/2015  . Gastro-esophageal reflux disease without esophagitis 02/25/2015  . Chronic recurrent major depressive disorder (HCC) 02/25/2015  . Obesity (BMI 30.0-34.9) 02/25/2015  . 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 Perennial allergic rhinitis with seasonal variation 02/25/2015  . Learning disability 02/25/2015    Past Surgical History:  Procedure Laterality Date  . CIRCUMCISION  1991  . SHOULDER ARTHROSCOPY Right 09/09/2015   Procedure: ARTHROSCOPY SHOULDER, RIGHT SHOULDER ATHROSCOPIC ANTERIOR AND POSTERIOR LABRAL TEAR REPAIR, CAPSULORRHAPHY;  Surgeon: Juanell Fairly, MD;  Location: [facility] ORS;  Service: Orthopedics;  Laterality: Right;    Family History  Problem Relation Age of Onset  . Bipolar disorder Sister   . Depression Brother   . Hypertension Mother   . Heart disease Mother   . Depression Mother   . Hypertension Father   . Bipolar disorder Father   . Hyperlipidemia Father   . Obesity Father     Social History   Tobacco Use  . Smoking status: Never Smoker  . Smokeless tobacco: Never Used  Substance Use Topics  . Alcohol use: No    Alcohol/week: 0.0 standard drinks     Current Outpatient Medications:  .  Cholecalciferol (VITAMIN D) 50 MCG (2000 UT) CAPS, Take 1  capsule (2,000 Units total) by mouth daily., Disp: 30 capsule, Rfl: 0 .  Melatonin 5 MG CAPS, Take by mouth., Disp: , Rfl:  .  atenolol (TENORMIN) 25 MG tablet, Take 1 tablet (25 mg total) by mouth daily. (Patient not taking: Reported on 05/31/2020), Disp: 90 tablet, Rfl: 0 .  escitalopram (LEXAPRO) 10 MG tablet, TAKE 1 TABLET(10 MG) BY MOUTH DAILY (Patient not taking: Reported on 05/31/2020), Disp: 90 tablet, Rfl: 1 .  hydrOXYzine (ATARAX/VISTARIL) 10 MG tablet, Take 1 tablet (10 mg total) by mouth 3 (three) times daily as  needed. It may make you feel sleepy (Patient not taking: Reported on 05/31/2020), Disp: 30 tablet, Rfl: 0  Allergies  Allergen Reactions  . Other Swelling    YOGURT  . Shellfish Allergy Hives    I personally reviewed active problem list, medication list, allergies, family history, social history, health maintenance with the patient/caregiver today.   ROS  Constitutional: Negative for fever or weight change.  Respiratory: Negative for cough and shortness of breath.   Cardiovascular: Negative for chest pain or palpitations.  Gastrointestinal: Negative for abdominal pain, no bowel changes.  Musculoskeletal: Negative for gait problem or joint swelling.  Skin: Negative for rash.  Neurological: Negative for dizziness , positive for headache.  No other specific complaints in a complete review of systems (except as listed in HPI above).  Objective  Vitals:   05/31/20 1329 05/31/20 1332  BP: (!) 170/120 (!) 180/124  Pulse: (!) 102   Resp: 16   Temp: 98 F (36.7 C)   TempSrc: Oral   SpO2: 99%   Weight: 229 lb 3.2 oz (104 kg)   Height: 6' (1.829 m)     Body mass index is 31.09 kg/m.  Physical Exam  Constitutional: Patient appears well-developed and well-nourished. Obese  No distress.  HEENT: head atraumatic, normocephalic, pupils equal and reactive to light,  neck supple Cardiovascular: Normal rate, regular rhythm and normal heart sounds.  No murmur heard. No BLE edema. Pulmonary/Chest: Effort normal and breath sounds normal. No respiratory distress. Abdominal: Soft.  There is no tenderness. Psychiatric: Patient has a normal mood and affect. behavior is normal. Judgment and thought content normal.  PHQ2/9: Depression screen [facility] 2/9 05/31/2020 02/24/2020 03/13/2018 09/24/2017 09/14/2017  Decreased Interest 2 2 0 1 3  Down, Depressed, Hopeless 2 1 0 1 2  PHQ - 2 Score 4 3 0 2 5  Altered sleeping 3 3 1 3 3   Tired, decreased energy 2 3 1 3 3   Change in appetite 1 0 0 2 3  Feeling  bad or failure about yourself  1 1 1 1 3   Trouble concentrating 3 1 1  0 0  Moving slowly or fidgety/restless 2 2 0 0 3  Suicidal thoughts 0 0 0 0 3  PHQ-9 Score 16 13 4 11 23   Difficult doing work/chores Somewhat difficult Somewhat difficult Not difficult at all Somewhat difficult Somewhat difficult  Some recent data might be hidden    phq 9 is positive  GAD 7 : Generalized Anxiety Score 05/31/2020 02/24/2020  Nervous, Anxious, on Edge 3 2  Control/stop worrying 3 0  Worry too much - different things 3 1  Trouble relaxing 3 3  Restless 3 0  Easily annoyed or irritable 3 1  Afraid - awful might happen 1 0  Total GAD 7 Score 19 7  Anxiety Difficulty Somewhat difficult Somewhat difficult    Fall Risk: Fall Risk  05/31/2020 02/24/2020 03/13/2018 09/14/2017 03/12/2017  Falls  in the past year? 0 0 No No No  Number falls in past yr: 0 0 - - -  Injury with Fall? 0 0 - - -  Follow up - Falls evaluation completed - - -     Functional Status Survey: Is the patient deaf or have difficulty hearing?: No Does the patient have difficulty seeing, even when wearing glasses/contacts?: No Does the patient have difficulty concentrating, remembering, or making decisions?: No Does the patient have difficulty walking or climbing stairs?: No Does the patient have difficulty dressing or bathing?: No Does the patient have difficulty doing errands alone such as visiting a doctor's office or shopping?: Yes    Assessment & Plan  1. Uncontrolled hypertension  - amLODipine (NORVASC) 2.5 MG tablet; Take 1-2 tablets (2.5-5 mg total) by mouth daily.  Dispense: 60 tablet; Refill: 0 - valsartan-hydrochlorothiazide (DIOVAN HCT) 160-12.5 MG tablet; Take 1 tablet by mouth daily.  Dispense: 30 tablet; Refill: 0  2. Need for immunization against influenza  - Flu Vaccine QUAD 36+ mos IM  3. Microalbuminuria  - valsartan-hydrochlorothiazide (DIOVAN HCT) 160-12.5 MG tablet; Take 1 tablet by mouth daily.  Dispense:  30 tablet; Refill: 0  4. Chronic recurrent major depressive disorder (HCC)  He is wiling to try another medication  5. Obesity (BMI 30.0-34.9)  Discussed with the patient the risk posed by an increased BMI. Discussed importance of portion control, calorie counting and at least 150 minutes of physical activity weekly. Avoid sweet beverages and drink more water. Eat at least 6 servings of fruit and vegetables daily   6. Insomnia, unspecified type  - QUEtiapine (SEROQUEL) 25 MG tablet; Take 1 tablet (25 mg total) by mouth at bedtime.  Dispense: 30 tablet; Refill: 1  7. Snoring  But ESS is 2   8. Benign hypertension with chronic kidney disease, stage I  - amLODipine (NORVASC) 2.5 MG tablet; Take 1-2 tablets (2.5-5 mg total) by mouth daily.  Dispense: 60 tablet; Refill: 0 - valsartan-hydrochlorothiazide (DIOVAN HCT) 160-12.5 MG tablet; Take 1 tablet by mouth daily.  Dispense: 30 tablet; Refill: 0  9. Anxiety, generalized  - PARoxetine (PAXIL) 10 MG tablet; Take 1 tablet (10 mg total) by mouth daily.  Dispense: 30 tablet; Refill: 1  10. Moderate episode of recurrent major depressive disorder (HCC)  - PARoxetine (PAXIL) 10 MG tablet; Take 1 tablet (10 mg total) by mouth daily.  Dispense: 30 tablet; Refill: 1

## 2020-06-01 ENCOUNTER — Other Ambulatory Visit: Payer: Self-pay

## 2020-06-01 DIAGNOSIS — I129 Hypertensive chronic kidney disease with stage 1 through stage 4 chronic kidney disease, or unspecified chronic kidney disease: Secondary | ICD-10-CM

## 2020-06-01 DIAGNOSIS — I10 Essential (primary) hypertension: Secondary | ICD-10-CM

## 2020-06-01 MED ORDER — AMLODIPINE BESYLATE 2.5 MG PO TABS: 2.5000 mg | ORAL_TABLET | Freq: Every day | ORAL | 0 refills | Status: DC

## 2020-06-07 ENCOUNTER — Other Ambulatory Visit: Payer: Self-pay | Admitting: Family Medicine

## 2020-06-07 DIAGNOSIS — I10 Essential (primary) hypertension: Secondary | ICD-10-CM

## 2020-06-28 ENCOUNTER — Other Ambulatory Visit: Payer: Self-pay | Admitting: Family Medicine

## 2020-06-28 DIAGNOSIS — N181 Chronic kidney disease, stage 1: Secondary | ICD-10-CM

## 2020-06-28 DIAGNOSIS — R809 Proteinuria, unspecified: Secondary | ICD-10-CM

## 2020-06-28 DIAGNOSIS — I129 Hypertensive chronic kidney disease with stage 1 through stage 4 chronic kidney disease, or unspecified chronic kidney disease: Secondary | ICD-10-CM

## 2020-06-28 DIAGNOSIS — I10 Essential (primary) hypertension: Secondary | ICD-10-CM

## 2020-06-28 NOTE — Telephone Encounter (Signed)
Requested Prescriptions  Pending Prescriptions Disp Refills   valsartan-hydrochlorothiazide (DIOVAN-HCT) 160-12.5 MG tablet [Pharmacy Med Name: VALSARTAN/HCTZ 160MG /12.5MG  TABLETS] 30 tablet 0    Sig: TAKE 1 TABLET BY MOUTH DAILY     Cardiovascular: ARB + Diuretic Combos Failed - 06/28/2020  3:12 AM      Failed - Last BP in normal range    BP Readings from Last 1 Encounters:  05/31/20 (!) 180/130         Passed - K in normal range and within 180 days    Potassium  Date Value Ref Range Status  02/24/2020 3.5 3.5 - 5.3 mmol/L Final         Passed - Na in normal range and within 180 days    Sodium  Date Value Ref Range Status  02/24/2020 139 135 - 146 mmol/L Final         Passed - Cr in normal range and within 180 days    Creat  Date Value Ref Range Status  02/24/2020 1.34 0.60 - 1.35 mg/dL Final   Creatinine, Urine  Date Value Ref Range Status  02/24/2020 102 20 - 320 mg/dL Final         Passed - Ca in normal range and within 180 days    Calcium  Date Value Ref Range Status  02/24/2020 9.5 8.6 - 10.3 mg/dL Final         Passed - Patient is not pregnant      Passed - Valid encounter within last 6 months    Recent Outpatient Visits          4 weeks ago Uncontrolled hypertension   [facility], MD   4 months ago Anxiety, generalized   [facility], MD   2 years ago School health examination   [facility], NP   2 years ago Chronic recurrent major depressive disorder [facility])   [facility], MD   2 years ago Neck pain, acute   [facility], Doren Custard      Future Appointments            In 2 weeks [place], [place] [facility], [facility]

## 2020-07-13 ENCOUNTER — Ambulatory Visit: Payer: PRIVATE HEALTH INSURANCE | Admitting: Family Medicine

## 2020-08-02 ENCOUNTER — Other Ambulatory Visit: Payer: Self-pay | Admitting: Family Medicine

## 2020-08-02 DIAGNOSIS — G4700 Insomnia, unspecified: Secondary | ICD-10-CM

## 2020-08-02 DIAGNOSIS — I10 Essential (primary) hypertension: Secondary | ICD-10-CM

## 2020-08-02 DIAGNOSIS — I129 Hypertensive chronic kidney disease with stage 1 through stage 4 chronic kidney disease, or unspecified chronic kidney disease: Secondary | ICD-10-CM

## 2020-08-02 DIAGNOSIS — R809 Proteinuria, unspecified: Secondary | ICD-10-CM

## 2020-08-02 NOTE — Telephone Encounter (Signed)
Tried calling to give patient appt for med refills but has not vm set up. He missed the last visit

## 2020-08-05 ENCOUNTER — Other Ambulatory Visit: Payer: Self-pay | Admitting: Family Medicine

## 2020-08-05 ENCOUNTER — Telehealth: Payer: Self-pay

## 2020-08-05 DIAGNOSIS — F411 Generalized anxiety disorder: Secondary | ICD-10-CM

## 2020-08-05 DIAGNOSIS — F331 Major depressive disorder, recurrent, moderate: Secondary | ICD-10-CM

## 2020-08-05 NOTE — Telephone Encounter (Signed)
Copied from CRM [id number]. Topic: General - Other >> Aug 05, 2020  1:45 PM Lyn Hollingshead D wrote: PT requesting his medication to be refill, he make in appt in January / please advise

## 2020-08-06 ENCOUNTER — Other Ambulatory Visit: Payer: Self-pay

## 2020-08-06 DIAGNOSIS — R7989 Other specified abnormal findings of blood chemistry: Secondary | ICD-10-CM

## 2020-08-06 DIAGNOSIS — N181 Chronic kidney disease, stage 1: Secondary | ICD-10-CM

## 2020-08-06 DIAGNOSIS — I10 Essential (primary) hypertension: Secondary | ICD-10-CM

## 2020-08-06 DIAGNOSIS — I129 Hypertensive chronic kidney disease with stage 1 through stage 4 chronic kidney disease, or unspecified chronic kidney disease: Secondary | ICD-10-CM

## 2020-08-06 DIAGNOSIS — R809 Proteinuria, unspecified: Secondary | ICD-10-CM

## 2020-08-06 MED ORDER — AMLODIPINE BESYLATE 2.5 MG PO TABS: 2.5000 mg | ORAL_TABLET | Freq: Every day | ORAL | 0 refills | Status: DC

## 2020-08-06 MED ORDER — VALSARTAN-HYDROCHLOROTHIAZIDE 160-12.5 MG PO TABS: 1.0000 | ORAL_TABLET | Freq: Every day | ORAL | 0 refills | Status: DC

## 2020-08-06 NOTE — Telephone Encounter (Signed)
Pt is scheduled for 08/10/2020.

## 2020-08-09 NOTE — Progress Notes (Signed)
Name: Mason Parker   MRN: [id number]    DOB: [birth year]   Date:08/10/2020       Progress Note  Subjective  Chief Complaint  Follow up   HPI    Obesity: he gained weight since last visit, he has been stress eating lately, but discussed importance of resuming diet   Major Depression and GAD: used to take mediation a long time ago, last visit was June 2021 and we tried Lexapro and hydroxizine, but he states unable to sleep on medication and stopped taking both. He has been under more stress at work, he responded well to paxil in the past and we resumed medication 05/2020 and we also added Seroquel but feels a little drowsy when he first gets up in am. He works second shift and get home around 2-3 am, he is able to fall asleep around 6 am but has interrupted sleep, he has difficulty calming down and wakes up feeling tired. Discussed changing seroquel to something else but he would like to hold off on changing for now  Uncontrolled HTN: BP was very high during his visit 180/130 we added Valsartan hctz and amlodipine. He is taking valsartan hctz however not taking norvasc due to insurance coverage denial, however he is wiling to get it from [employer] and return in one-two weeks for bp recheck. Chest pain and headache resolved.   GERD: he states flaring over the past couple of days, he states stress is higher, worried about his dog's health. He takes medication prn Pepcid and it seems to help   Tinnitus bilaterally: going on for years but seems to be getting worse, he thinks right is worse than left. No dizziness or nausea or vertigo, he is not sure of hearing loss. Discussed usually benign if bilateral but if unilateral needs to see ENT  Patient Active Problem List   Diagnosis Date Noted  . Elevated blood-pressure reading without diagnosis of hypertension 03/12/2017  . 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 Recurrent dislocation of right shoulder  07/16/2015  . Verruca warts (infectious) 06/03/2015  . Anxiety, generalized 02/25/2015  . Insomnia 02/25/2015  . Gastro-esophageal reflux disease without esophagitis 02/25/2015  . Chronic recurrent major depressive disorder (HCC) 02/25/2015  . Obesity (BMI 30.0-34.9) 02/25/2015  . 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 Perennial allergic rhinitis with seasonal variation 02/25/2015  . Learning disability 02/25/2015    Past Surgical History:  Procedure Laterality Date  . CIRCUMCISION  1991  . SHOULDER ARTHROSCOPY Right 09/09/2015   Procedure: ARTHROSCOPY SHOULDER, RIGHT SHOULDER ATHROSCOPIC ANTERIOR AND POSTERIOR LABRAL TEAR REPAIR, CAPSULORRHAPHY;  Surgeon: Juanell Fairly, MD;  Location: [facility] ORS;  Service: Orthopedics;  Laterality: Right;    Family History  Problem Relation Age of Onset  . Bipolar disorder Sister   . Depression Brother   . Hypertension Mother   . Heart disease Mother   . Depression Mother   . Hypertension Father   . Bipolar disorder Father   . Hyperlipidemia Father   . Obesity Father     Social History   Tobacco Use  . Smoking status: Never Smoker  . Smokeless tobacco: Never Used  Substance Use Topics  . Alcohol use: No    Alcohol/week: 0.0 standard drinks     Current Outpatient Medications:  .  Cholecalciferol (VITAMIN D) 50 MCG (2000 UT) CAPS, Take 1 capsule (2,000 Units total) by mouth daily., Disp: 30 capsule, Rfl: 0 .  Melatonin 5 MG CAPS, Take by mouth., Disp: , Rfl:  .  PARoxetine (PAXIL) 10 MG tablet, TAKE 1 TABLET(10 MG) BY MOUTH DAILY, Disp: 30 tablet, Rfl: 1 .  QUEtiapine (SEROQUEL) 25 MG tablet, Take 1 tablet (25 mg total) by mouth at bedtime., Disp: 30 tablet, Rfl: 1 .  valsartan-hydrochlorothiazide (DIOVAN-HCT) 160-12.5 MG tablet, Take 1 tablet by mouth daily., Disp: 10 tablet, Rfl: 0 .  amLODipine (NORVASC) 2.5 MG tablet, Take 1-2 tablets (2.5-5 mg total) by mouth daily., Disp: 10 tablet, Rfl: 0  Allergies  Allergen Reactions  . Other  Swelling    YOGURT  . Shellfish Allergy Hives    I personally reviewed active problem list, medication list, allergies, family history, social history, health maintenance with the patient/caregiver today.   ROS  Constitutional: Negative for fever , positive for  weight change.  Respiratory: Negative for cough and shortness of breath.   Cardiovascular: Negative for chest pain or palpitations.  Gastrointestinal: Negative for abdominal pain, no bowel changes.  Musculoskeletal: Negative for gait problem or joint swelling.  Skin: Negative for rash.  Neurological: Negative for dizziness or headache.  No other specific complaints in a complete review of systems (except as listed in HPI above).  Objective  Vitals:   08/10/20 1135 08/10/20 1139  BP: (!) 152/100 (!) 150/90  Pulse: 75   Resp: 16   Temp: 98.2 F (36.8 C)   TempSrc: Oral   SpO2: 99%   Weight: 235 lb 8 oz (106.8 kg)   Height: 6' (1.829 m)     Body mass index is 31.94 kg/m.  Physical Exam  Constitutional: Patient appears well-developed and well-nourished. Obese No distress.  HEENT: head atraumatic, normocephalic, pupils equal and reactive to light, neck supple, Cardiovascular: Normal rate, regular rhythm and normal heart sounds.  No murmur heard. No BLE edema. Pulmonary/Chest: Effort normal and breath sounds normal. No respiratory distress. Abdominal: Soft.  There is no tenderness. Psychiatric: Patient has a normal mood and affect. behavior is normal. Judgment and thought content normal.   PHQ2/9: Depression screen [facility] 2/9 08/10/2020 05/31/2020 02/24/2020 03/13/2018 09/24/2017  Decreased Interest 3 2 2  0 1  Down, Depressed, Hopeless 2 2 1  0 1  PHQ - 2 Score 5 4 3  0 2  Altered sleeping 3 3 3 1 3   Tired, decreased energy 3 2 3 1 3   Change in appetite 1 1 0 0 2  Feeling bad or failure about yourself  1 1 1 1 1   Trouble concentrating 2 3 1 1  0  Moving slowly or fidgety/restless 1 2 2  0 0  Suicidal thoughts 0 0 0 0 0   PHQ-9 Score 16 16 13 4 11   Difficult doing work/chores Somewhat difficult Somewhat difficult Somewhat difficult Not difficult at all Somewhat difficult  Some recent data might be hidden    phq 9 is positive  GAD 7 : Generalized Anxiety Score 08/10/2020 05/31/2020 02/24/2020  Nervous, Anxious, on Edge 3 3 2   Control/stop worrying 3 3 0  Worry too much - different things 3 3 1   Trouble relaxing 3 3 3   Restless 1 3 0  Easily annoyed or irritable 1 3 1   Afraid - awful might happen 0 1 0  Total GAD 7 Score 14 19 7   Anxiety Difficulty Somewhat difficult Somewhat difficult Somewhat difficult     Fall Risk: Fall Risk  08/10/2020 05/31/2020 02/24/2020 03/13/2018 09/14/2017  Falls in the past year? 0 0 0 No No  Number falls in past yr: 0 0 0 - -  Injury with Fall? 0 0  0 - -  Follow up - - Falls evaluation completed - -    Functional Status Survey: Is the patient deaf or have difficulty hearing?: No Does the patient have difficulty seeing, even when wearing glasses/contacts?: No Does the patient have difficulty concentrating, remembering, or making decisions?: No Does the patient have difficulty walking or climbing stairs?: No Does the patient have difficulty dressing or bathing?: No Does the patient have difficulty doing errands alone such as visiting a doctor's office or shopping?: No    Assessment & Plan  1. Tinnitus of both ears  Reassurance for now   2. Uncontrolled hypertension  - amLODipine (NORVASC) 2.5 MG tablet; Take 1-2 tablets (2.5-5 mg total) by mouth daily.  Dispense: 90 tablet; Refill: 0 - valsartan-hydrochlorothiazide (DIOVAN-HCT) 160-12.5 MG tablet; Take 1 tablet by mouth daily.  Dispense: 90 tablet; Refill: 0  3. Obesity (BMI 30.0-34.9)  Discussed with the patient the risk posed by an increased BMI. Discussed importance of portion control, calorie counting and at least 150 minutes of physical activity weekly. Avoid sweet beverages and drink more water. Eat at least  6 servings of fruit and vegetables daily   4. Microalbuminuria  - valsartan-hydrochlorothiazide (DIOVAN-HCT) 160-12.5 MG tablet; Take 1 tablet by mouth daily.  Dispense: 90 tablet; Refill: 0  5. Anxiety, generalized  - PARoxetine (PAXIL) 20 MG tablet; Take 1 tablet (20 mg total) by mouth daily.  Dispense: 90 tablet; Refill: 0  6. Chronic recurrent major depressive disorder (HCC)   7. Low vitamin D level   8. Benign hypertension with chronic kidney disease, stage I  - amLODipine (NORVASC) 2.5 MG tablet; Take 1-2 tablets (2.5-5 mg total) by mouth daily.  Dispense: 90 tablet; Refill: 0 - valsartan-hydrochlorothiazide (DIOVAN-HCT) 160-12.5 MG tablet; Take 1 tablet by mouth daily.  Dispense: 90 tablet; Refill: 0  9. Moderate episode of recurrent major depressive disorder (HCC)  - PARoxetine (PAXIL) 20 MG tablet; Take 1 tablet (20 mg total) by mouth daily.  Dispense: 90 tablet; Refill: 0  10. Insomnia, unspecified type  - QUEtiapine (SEROQUEL) 25 MG tablet; Take 1 tablet (25 mg total) by mouth at bedtime.  Dispense: 90 tablet; Refill: 0

## 2020-08-10 ENCOUNTER — Encounter: Payer: Self-pay | Admitting: Family Medicine

## 2020-08-10 ENCOUNTER — Other Ambulatory Visit: Payer: Self-pay

## 2020-08-10 ENCOUNTER — Ambulatory Visit (INDEPENDENT_AMBULATORY_CARE_PROVIDER_SITE_OTHER): Payer: PRIVATE HEALTH INSURANCE | Admitting: Family Medicine

## 2020-08-10 VITALS — BP 150/90 | HR 75 | Temp 98.2°F | Resp 16 | Ht 72.0 in | Wt 235.5 lb

## 2020-08-10 DIAGNOSIS — H9313 Tinnitus, bilateral: Secondary | ICD-10-CM | POA: Insufficient documentation

## 2020-08-10 DIAGNOSIS — E669 Obesity, unspecified: Secondary | ICD-10-CM | POA: Diagnosis not present

## 2020-08-10 DIAGNOSIS — F331 Major depressive disorder, recurrent, moderate: Secondary | ICD-10-CM | POA: Insufficient documentation

## 2020-08-10 DIAGNOSIS — R809 Proteinuria, unspecified: Secondary | ICD-10-CM | POA: Diagnosis not present

## 2020-08-10 DIAGNOSIS — I10 Essential (primary) hypertension: Secondary | ICD-10-CM | POA: Diagnosis not present

## 2020-08-10 DIAGNOSIS — F411 Generalized anxiety disorder: Secondary | ICD-10-CM

## 2020-08-10 DIAGNOSIS — F339 Major depressive disorder, recurrent, unspecified: Secondary | ICD-10-CM

## 2020-08-10 DIAGNOSIS — I129 Hypertensive chronic kidney disease with stage 1 through stage 4 chronic kidney disease, or unspecified chronic kidney disease: Secondary | ICD-10-CM | POA: Insufficient documentation

## 2020-08-10 DIAGNOSIS — R7989 Other specified abnormal findings of blood chemistry: Secondary | ICD-10-CM | POA: Insufficient documentation

## 2020-08-10 DIAGNOSIS — G4700 Insomnia, unspecified: Secondary | ICD-10-CM

## 2020-08-10 DIAGNOSIS — N181 Chronic kidney disease, stage 1: Secondary | ICD-10-CM

## 2020-08-10 MED ORDER — AMLODIPINE BESYLATE 2.5 MG PO TABS: 2.5000 mg | ORAL_TABLET | Freq: Every day | ORAL | 0 refills | Status: DC

## 2020-08-10 MED ORDER — VALSARTAN-HYDROCHLOROTHIAZIDE 160-12.5 MG PO TABS: 1.0000 | ORAL_TABLET | Freq: Every day | ORAL | 0 refills | Status: DC

## 2020-08-10 MED ORDER — PAROXETINE HCL 20 MG PO TABS: 20.0000 mg | ORAL_TABLET | Freq: Every day | ORAL | 0 refills | Status: DC

## 2020-08-10 MED ORDER — QUETIAPINE FUMARATE 25 MG PO TABS: 25.0000 mg | ORAL_TABLET | Freq: Every day | ORAL | 0 refills | Status: DC

## 2020-08-11 ENCOUNTER — Other Ambulatory Visit: Payer: Self-pay | Admitting: Family Medicine

## 2020-08-11 DIAGNOSIS — I129 Hypertensive chronic kidney disease with stage 1 through stage 4 chronic kidney disease, or unspecified chronic kidney disease: Secondary | ICD-10-CM

## 2020-08-11 DIAGNOSIS — I10 Essential (primary) hypertension: Secondary | ICD-10-CM

## 2020-08-13 ENCOUNTER — Other Ambulatory Visit: Payer: Self-pay | Admitting: Family Medicine

## 2020-08-13 DIAGNOSIS — I129 Hypertensive chronic kidney disease with stage 1 through stage 4 chronic kidney disease, or unspecified chronic kidney disease: Secondary | ICD-10-CM

## 2020-08-13 DIAGNOSIS — R809 Proteinuria, unspecified: Secondary | ICD-10-CM

## 2020-08-13 DIAGNOSIS — I10 Essential (primary) hypertension: Secondary | ICD-10-CM

## 2020-08-24 ENCOUNTER — Other Ambulatory Visit: Payer: Self-pay

## 2020-08-24 ENCOUNTER — Ambulatory Visit: Payer: PRIVATE HEALTH INSURANCE

## 2020-08-24 VITALS — BP 138/90

## 2020-08-24 DIAGNOSIS — Z0130 Encounter for examination of blood pressure without abnormal findings: Secondary | ICD-10-CM

## 2020-10-01 ENCOUNTER — Ambulatory Visit: Payer: PRIVATE HEALTH INSURANCE | Admitting: Family Medicine

## 2020-11-08 NOTE — Progress Notes (Signed)
Name: Mason Parker   MRN: [id number]    DOB: 06-Jul-1991   Date:11/10/2020       Progress Note  Subjective  Chief Complaint  Follow Up  HPI  Obesity: he gained 2 lbs since last visit, he is working 10 hours per day , 6 days a week. Eating breakfast at home, Atkins shake for lunch, dinner at home. He drinks sodas but usually diet sprite, very seldom has a regular soda    Major Depression and GAD: he had a gap in insurance and lost to follow up, however he has been working and is back on paxil since 05/2020 and we also added Seroquel but he felt  drowsy when he first gets up in am so he stopped medication . He works second shift and get home around 2-3 am, he is able to fall asleep around 6 am but has interrupted sleep, he has difficulty calming down and wakes up feeling tired. 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 He is on Valsartan hctz and amlodipine, however out of norvasc for the past 4 days, we will change dose of valsartan to 320 mg continue hctz at 12.5 mg since it cause urinary frequency and stop norvasc   GERD: he states when he eats less he has more symptoms. He states worse symptoms around 11 pm while at work. He is taking otc medication and does not want a rx at this time   Tinnitus bilaterally: going on for years but seems to be getting worse, he is sure worse on the right side. No dizziness or nausea or vertigo, he is not sure of hearing loss. Explained he should see ENT but he wants to hold off for now   Patient Active Problem List   Diagnosis Date Noted  . Moderate episode of recurrent major depressive disorder (HCC) 08/10/2020  . Uncontrolled hypertension 08/10/2020  . Tinnitus of both ears 08/10/2020  . Benign hypertension with chronic kidney disease, stage I 08/10/2020  . Low vitamin D level 08/10/2020  . Aftercare following right shoulder  joint replacement surgery 11/29/2015  . Recurrent dislocation of right shoulder 07/16/2015  . Verruca warts (infectious) 06/03/2015  . Anxiety, generalized 02/25/2015  . Insomnia 02/25/2015  . Gastro-esophageal reflux disease without esophagitis 02/25/2015  . Chronic recurrent major depressive disorder (HCC) 02/25/2015  . Obesity (BMI 30.0-34.9) 02/25/2015  . 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 Perennial allergic rhinitis with seasonal variation 02/25/2015  . Learning disability 02/25/2015    Past Surgical History:  Procedure Laterality Date  . CIRCUMCISION  1991  . SHOULDER ARTHROSCOPY Right 09/09/2015   Procedure: ARTHROSCOPY SHOULDER, RIGHT SHOULDER ATHROSCOPIC ANTERIOR AND POSTERIOR LABRAL TEAR REPAIR, CAPSULORRHAPHY;  Surgeon: Juanell Fairly, MD;  Location: [facility] ORS;  Service: Orthopedics;  Laterality: Right;    Family History  Problem Relation Age of Onset  . Bipolar disorder Sister   . Depression Brother   . Hypertension Mother   . Heart disease Mother   . Depression Mother   . Hypertension Father   . Bipolar disorder Father   . Hyperlipidemia Father   . Obesity Father     Social History   Tobacco Use  . Smoking status: Never Smoker  . Smokeless tobacco: Never Used  Substance Use Topics  . Alcohol use: No    Alcohol/week: 0.0 standard drinks  Current Outpatient Medications:  .  amLODipine (NORVASC) 2.5 MG tablet, TAKE 1 TO 2 TABLETS(2.5 TO 5 MG) BY MOUTH DAILY, Disp: 90 tablet, Rfl: 0 .  Cholecalciferol (VITAMIN D) 50 MCG (2000 UT) CAPS, Take 1 capsule (2,000 Units total) by mouth daily., Disp: 30 capsule, Rfl: 0 .  Melatonin 5 MG CAPS, Take by mouth., Disp: , Rfl:  .  PARoxetine (PAXIL) 20 MG tablet, Take 1 tablet (20 mg total) by mouth daily., Disp: 90 tablet, Rfl: 0 .  valsartan-hydrochlorothiazide (DIOVAN-HCT) 160-12.5 MG tablet, Take 1 tablet by mouth daily., Disp: 90 tablet, Rfl: 0 .  QUEtiapine (SEROQUEL) 25 MG tablet, Take 1 tablet (25 mg total) by mouth  at bedtime. (Patient not taking: Reported on 11/10/2020), Disp: 90 tablet, Rfl: 0  Allergies  Allergen Reactions  . Other Swelling    YOGURT  . Shellfish Allergy Hives    I personally reviewed active problem list, medication list, allergies, family history, social history, health maintenance, notes from last encounter with the patient/caregiver today.   ROS  Constitutional: Negative for fever or weight change.  Respiratory: Negative for cough and shortness of breath.   Cardiovascular: Negative for chest pain or palpitations.  Gastrointestinal: Negative for abdominal pain, no bowel changes.  Musculoskeletal: Negative for gait problem or joint swelling.  Skin: Negative for rash.  Neurological: Negative for dizziness or headache.  No other specific complaints in a complete review of systems (except as listed in HPI above).   Objective  Vitals:   11/10/20 1326  BP: 138/86  Pulse: 87  Resp: 16  Temp: 98.1 F (36.7 C)  TempSrc: Oral  SpO2: 98%  Weight: 238 lb (108 kg)  Height: 6' (1.829 m)    Body mass index is 32.28 kg/m.  Physical Exam  Constitutional: Patient appears well-developed and well-nourished. Obese  No distress.  HEENT: head atraumatic, normocephalic, pupils equal and reactive to light,  neck supple Cardiovascular: Normal rate, regular rhythm and normal heart sounds.  No murmur heard. No BLE edema. Pulmonary/Chest: Effort normal and breath sounds normal. No respiratory distress. Abdominal: Soft.  There is no tenderness. Psychiatric: Patient has a normal mood and affect. behavior is normal. Judgment and thought content normal.  PHQ2/9: Depression screen [facility] 2/9 11/10/2020 08/10/2020 05/31/2020 02/24/2020 03/13/2018  Decreased Interest 2 3 2 2  0  Down, Depressed, Hopeless 2 2 2 1  0  PHQ - 2 Score 4 5 4 3  0  Altered sleeping 3 3 3 3 1   Tired, decreased energy 3 3 2 3 1   Change in appetite 0 1 1 0 0  Feeling bad or failure about yourself  1 1 1 1 1   Trouble  concentrating 0 2 3 1 1   Moving slowly or fidgety/restless 0 1 2 2  0  Suicidal thoughts 0 0 0 0 0  PHQ-9 Score 11 16 16 13 4   Difficult doing work/chores - Somewhat difficult Somewhat difficult Somewhat difficult Not difficult at all  Some recent data might be hidden    phq 9 is positive   Fall Risk: Fall Risk  11/10/2020 08/10/2020 05/31/2020 02/24/2020 03/13/2018  Falls in the past year? 0 0 0 0 No  Number falls in past yr: 0 0 0 0 -  Injury with Fall? 0 0 0 0 -  Follow up - - - Falls evaluation completed -      Functional Status Survey: Is the patient deaf or have difficulty hearing?: No Does the patient have difficulty seeing, even when wearing glasses/contacts?:  No Does the patient have difficulty concentrating, remembering, or making decisions?: No Does the patient have difficulty walking or climbing stairs?: No Does the patient have difficulty dressing or bathing?: No Does the patient have difficulty doing errands alone such as visiting a doctor's office or shopping?: No    Assessment & Plan  1. Moderate episode of recurrent major depressive disorder (HCC)  - PARoxetine (PAXIL) 40 MG tablet; Take 1 tablet (40 mg total) by mouth daily.  Dispense: 90 tablet; Refill: 0  2. Benign hypertension with chronic kidney disease, stage I  - valsartan-hydrochlorothiazide (DIOVAN-HCT) 320-12.5 MG tablet; Take 1 tablet by mouth daily.  Dispense: 90 tablet; Refill: 0  3. Microalbuminuria  Increase dose of valsartan   4. Anxiety, generalized  - PARoxetine (PAXIL) 40 MG tablet; Take 1 tablet (40 mg total) by mouth daily.  Dispense: 90 tablet; Refill: 0

## 2020-11-10 ENCOUNTER — Ambulatory Visit (INDEPENDENT_AMBULATORY_CARE_PROVIDER_SITE_OTHER): Payer: PRIVATE HEALTH INSURANCE | Admitting: Family Medicine

## 2020-11-10 ENCOUNTER — Encounter: Payer: Self-pay | Admitting: Family Medicine

## 2020-11-10 ENCOUNTER — Other Ambulatory Visit: Payer: Self-pay

## 2020-11-10 DIAGNOSIS — R809 Proteinuria, unspecified: Secondary | ICD-10-CM | POA: Diagnosis not present

## 2020-11-10 DIAGNOSIS — F411 Generalized anxiety disorder: Secondary | ICD-10-CM

## 2020-11-10 DIAGNOSIS — I129 Hypertensive chronic kidney disease with stage 1 through stage 4 chronic kidney disease, or unspecified chronic kidney disease: Secondary | ICD-10-CM | POA: Diagnosis not present

## 2020-11-10 DIAGNOSIS — N181 Chronic kidney disease, stage 1: Secondary | ICD-10-CM

## 2020-11-10 DIAGNOSIS — F331 Major depressive disorder, recurrent, moderate: Secondary | ICD-10-CM | POA: Diagnosis not present

## 2020-11-10 MED ORDER — VALSARTAN-HYDROCHLOROTHIAZIDE 320-12.5 MG PO TABS: 1.0000 | ORAL_TABLET | Freq: Every day | ORAL | 0 refills | Status: DC

## 2020-11-10 MED ORDER — PAROXETINE HCL 40 MG PO TABS: 40.0000 mg | ORAL_TABLET | Freq: Every day | ORAL | 0 refills | Status: DC

## 2021-01-26 ENCOUNTER — Ambulatory Visit: Payer: PRIVATE HEALTH INSURANCE | Admitting: Family Medicine

## 2021-01-26 NOTE — Progress Notes (Deleted)
Name: Mason Parker   MRN: [id number]    DOB: [birth year]   Date:01/26/2021       Progress Note  Subjective  Chief Complaint  Follow Up  HPI  Obesity: he gained 2 lbs since last visit, he is working 10 hours per day , 6 days a week. Eating breakfast at home, Atkins shake for lunch, dinner at home. He drinks sodas but usually diet sprite, very seldom has a regular soda    Major Depression and GAD: he had a gap in insurance and lost to follow up, however he has been working and is back on paxil since 05/2020 and we also added Seroquel but he felt  drowsy when he first gets up in am so he stopped medication . He works second shift and get home around 2-3 am, he is able to fall asleep around 6 am but has interrupted sleep, he has difficulty calming down and wakes up feeling tired. 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 He is on Valsartan hctz and amlodipine, however out of norvasc for the past 4 days, we will change dose of valsartan to 320 mg continue hctz at 12.5 mg since it cause urinary frequency and stop norvasc   GERD: he states when he eats less he has more symptoms. He states worse symptoms around 11 pm while at work. He is taking otc medication and does not want a rx at this time   Tinnitus bilaterally: going on for years but seems to be getting worse, he is sure worse on the right side. No dizziness or nausea or vertigo, he is not sure of hearing loss. Explained he should see ENT but he wants to hold off for now    Patient Active Problem List   Diagnosis Date Noted  . Moderate episode of recurrent major depressive disorder (HCC) 08/10/2020  . Uncontrolled hypertension 08/10/2020  . Tinnitus of both ears 08/10/2020  . Benign hypertension with chronic kidney disease, stage I 08/10/2020  . Low vitamin D level 08/10/2020  . Aftercare following right  shoulder joint replacement surgery 11/29/2015  . Recurrent dislocation of right shoulder 07/16/2015  . Verruca warts (infectious) 06/03/2015  . Anxiety, generalized 02/25/2015  . Insomnia 02/25/2015  . Gastro-esophageal reflux disease without esophagitis 02/25/2015  . Chronic recurrent major depressive disorder (HCC) 02/25/2015  . Obesity (BMI 30.0-34.9) 02/25/2015  . 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 Perennial allergic rhinitis with seasonal variation 02/25/2015  . Learning disability 02/25/2015    Past Surgical History:  Procedure Laterality Date  . CIRCUMCISION  1991  . SHOULDER ARTHROSCOPY Right 09/09/2015   Procedure: ARTHROSCOPY SHOULDER, RIGHT SHOULDER ATHROSCOPIC ANTERIOR AND POSTERIOR LABRAL TEAR REPAIR, CAPSULORRHAPHY;  Surgeon: Juanell Fairly, MD;  Location: [facility] ORS;  Service: Orthopedics;  Laterality: Right;    Family History  Problem Relation Age of Onset  . Bipolar disorder Sister   . Depression Brother   . Hypertension Mother   . Heart disease Mother   . Depression Mother   . Hypertension Father   . Bipolar disorder Father   . Hyperlipidemia Father   . Obesity Father     Social History   Tobacco Use  . Smoking status: Never Smoker  . Smokeless tobacco: Never Used  Substance Use Topics  . Alcohol use: No    Alcohol/week: 0.0 standard drinks  Current Outpatient Medications:  .  Cholecalciferol (VITAMIN D) 50 MCG (2000 UT) CAPS, Take 1 capsule (2,000 Units total) by mouth daily., Disp: 30 capsule, Rfl: 0 .  Melatonin 5 MG CAPS, Take by mouth., Disp: , Rfl:  .  PARoxetine (PAXIL) 40 MG tablet, Take 1 tablet (40 mg total) by mouth daily., Disp: 90 tablet, Rfl: 0 .  valsartan-hydrochlorothiazide (DIOVAN-HCT) 320-12.5 MG tablet, Take 1 tablet by mouth daily., Disp: 90 tablet, Rfl: 0  Allergies  Allergen Reactions  . Other Swelling    YOGURT  . Shellfish Allergy Hives    I personally reviewed {Reviewed:14835} with the patient/caregiver  today.   ROS  ***  Objective  There were no vitals filed for this visit.  There is no height or weight on file to calculate BMI.  Physical Exam ***  No results found for this or any previous visit (from the past 2160 hour(s)).  Diabetic Foot Exam: Diabetic Foot Exam - Simple   No data filed    ***  PHQ2/9: Depression screen [facility] 2/9 11/10/2020 08/10/2020 05/31/2020 02/24/2020 03/13/2018  Decreased Interest 2 3 2 2  0  Down, Depressed, Hopeless 2 2 2 1  0  PHQ - 2 Score 4 5 4 3  0  Altered sleeping 3 3 3 3 1   Tired, decreased energy 3 3 2 3 1   Change in appetite 0 1 1 0 0  Feeling bad or failure about yourself  1 1 1 1 1   Trouble concentrating 0 2 3 1 1   Moving slowly or fidgety/restless 0 1 2 2  0  Suicidal thoughts 0 0 0 0 0  PHQ-9 Score 11 16 16 13 4   Difficult doing work/chores - Somewhat difficult Somewhat difficult Somewhat difficult Not difficult at all  Some recent data might be hidden    phq 9 is {gen pos ***  Fall Risk: Fall Risk  11/10/2020 08/10/2020 05/31/2020 02/24/2020 03/13/2018  Falls in the past year? 0 0 0 0 No  Number falls in past yr: 0 0 0 0 -  Injury with Fall? 0 0 0 0 -  Follow up - - - Falls evaluation completed -   ***   Functional Status Survey:   ***   Assessment & Plan  *** There are no diagnoses linked to this encounter.

## 2021-04-07 NOTE — Patient Instructions (Signed)
Preventive Care 40-31 Years Old, Male Preventive care refers to lifestyle choices and visits with your health care provider that can promote health and wellness. This includes: A yearly physical exam. This is also called an annual wellness visit. Regular dental and eye exams. Immunizations. Screening for certain conditions. Healthy lifestyle choices, such as: Eating a healthy diet. Getting regular exercise. Not using drugs or products that contain nicotine and tobacco. Limiting alcohol use. What can I expect for my preventive care visit? Physical exam Your health care provider will check your: Height and weight. These may be used to calculate your BMI (body mass index). BMI is a measurement that tells if you are at a healthy weight. Heart rate and blood pressure. Body temperature. Skin for abnormal spots. Counseling Your health care provider may ask you questions about your: Past medical problems. Family's medical history. Alcohol, tobacco, and drug use. Emotional well-being. Home life and relationship well-being. Sexual activity. Diet, exercise, and sleep habits. Work and work environment. Access to firearms. What immunizations do I need?  Vaccines are usually given at various ages, according to a schedule. Your health care provider will recommend vaccines for you based on your age, medicalhistory, and lifestyle or other factors, such as travel or where you work. What tests do I need? Blood tests Lipid and cholesterol levels. These may be checked every 5 years, or more often if you are over 50 years old. Hepatitis C test. Hepatitis B test. Screening Lung cancer screening. You may have this screening every year starting at age 55 if you have a 30-pack-year history of smoking and currently smoke or have quit within the past 15 years. Prostate cancer screening. Recommendations will vary depending on your family history and other risks. Genital exam to check for testicular cancer  or hernias. Colorectal cancer screening. All adults should have this screening starting at age 50 and continuing until age 75. Your health care provider may recommend screening at age 45 if you are at increased risk. You will have tests every 1-10 years, depending on your results and the type of screening test. Diabetes screening. This is done by checking your blood sugar (glucose) after you have not eaten for a while (fasting). You may have this done every 1-3 years. STD (sexually transmitted disease) testing, if you are at risk. Follow these instructions at home: Eating and drinking  Eat a diet that includes fresh fruits and vegetables, whole grains, lean protein, and low-fat dairy products. Take vitamin and mineral supplements as recommended by your health care provider. Do not drink alcohol if your health care provider tells you not to drink. If you drink alcohol: Limit how much you have to 0-2 drinks a day. Be aware of how much alcohol is in your drink. In the [place] one drink equals one 12 oz bottle of beer (355 mL), one 5 oz glass of wine (148 mL), or one 1 oz glass of hard liquor (44 mL).  Lifestyle Take daily care of your teeth and gums. Brush your teeth every morning and night with fluoride toothpaste. Floss one time each day. Stay active. Exercise for at least 30 minutes 5 or more days each week. Do not use any products that contain nicotine or tobacco, such as cigarettes, e-cigarettes, and chewing tobacco. If you need help quitting, ask your health care provider. Do not use drugs. If you are sexually active, practice safe sex. Use a condom or other form of protection to prevent STIs (sexually transmitted infections). If told by   your health care provider, take low-dose aspirin daily starting at age 50. Find healthy ways to cope with stress, such as: Meditation, yoga, or listening to music. Journaling. Talking to a trusted person. Spending time with friends and  family. Safety Always wear your seat belt while driving or riding in a vehicle. Do not drive: If you have been drinking alcohol. Do not ride with someone who has been drinking. When you are tired or distracted. While texting. Wear a helmet and other protective equipment during sports activities. If you have firearms in your house, make sure you follow all gun safety procedures. What's next? Go to your health care provider once a year for an annual wellness visit. Ask your health care provider how often you should have your eyes and teeth checked. Stay up to date on all vaccines. This information is not intended to replace advice given to you by your health care provider. Make sure you discuss any questions you have with your healthcare provider. Document Revised: 06/03/2019 Document Reviewed: 08/29/2018 Elsevier Patient Education  2022 Elsevier Inc.  

## 2021-04-07 NOTE — Progress Notes (Signed)
Name: Mason Parker   MRN: [id number]    DOB: 07/17/1991   Date:04/08/2021       Progress Note  Subjective  Chief Complaint  Annual Exam  HPI  Patient presents for annual CPE.  IPSS Questionnaire (AUA-7): Over the past month.   1)  How often have you had a sensation of not emptying your bladder completely after you finish urinating?  0 - Not at all  2)  How often have you had to urinate again less than two hours after you finished urinating? 0 - Not at all  3)  How often have you found you stopped and started again several times when you urinated?  0 - Not at all  4) How difficult have you found it to postpone urination?  0 - Not at all  5) How often have you had a weak urinary stream?  0 - Not at all  6) How often have you had to push or strain to begin urination?  0 - Not at all  7) How many times did you most typically get up to urinate from the time you went to bed until the time you got up in the morning?  0 - None  Total score:  0-7 mildly symptomatic   8-19 moderately symptomatic   20-35 severely symptomatic     Diet: he has been packing his lunch Exercise: continue regular physical activity   Depression: phq 9 is positive Depression screen [facility] 2/9 04/08/2021 11/10/2020 08/10/2020 05/31/2020 02/24/2020  Decreased Interest 1 2 3 2 2   Down, Depressed, Hopeless 1 2 2 2 1   PHQ - 2 Score 2 4 5 4 3   Altered sleeping 3 3 3 3 3   Tired, decreased energy 3 3 3 2 3   Change in appetite 0 0 1 1 0  Feeling bad or failure about yourself  1 1 1 1 1   Trouble concentrating 0 0 2 3 1   Moving slowly or fidgety/restless 0 0 1 2 2   Suicidal thoughts 0 0 0 0 0  PHQ-9 Score 9 11 16 16 13   Difficult doing work/chores - - Somewhat difficult Somewhat difficult Somewhat difficult  Some recent data might be hidden    Hypertension:  BP Readings from Last 3 Encounters:  04/08/21 (!) 140/92  11/10/20 138/86  08/24/20 138/90    Obesity: Wt Readings from Last 3 Encounters:  04/08/21 246 lb  (111.6 kg)  11/10/20 238 lb (108 kg)  08/10/20 235 lb 8 oz (106.8 kg)   BMI Readings from Last 3 Encounters:  04/08/21 33.36 kg/m  11/10/20 32.28 kg/m  08/10/20 31.94 kg/m     Lipids:  Lab Results  Component Value Date   CHOL 135 02/24/2020   CHOL 137 09/26/2017   CHOL 111 05/06/2014   Lab Results  Component Value Date   HDL 53 02/24/2020   HDL 40 (L) 09/26/2017   HDL 38 05/06/2014   Lab Results  Component Value Date   LDLCALC 66 02/24/2020   LDLCALC 73 09/26/2017   LDLCALC 50 05/06/2014   Lab Results  Component Value Date   TRIG 84 02/24/2020   TRIG 158 (H) 09/26/2017   TRIG 114 05/06/2014   Lab Results  Component Value Date   CHOLHDL 2.5 02/24/2020   CHOLHDL 3.4 09/26/2017   No results found for: LDLDIRECT Glucose:  Glucose, Bld  Date Value Ref Range Status  02/24/2020 98 65 - 99 mg/dL Final    Comment:    .  Fasting reference interval .   09/26/2017 105 (H) 65 - 99 mg/dL Final    Comment:    .            Fasting reference interval . For someone without known diabetes, a glucose value between 100 and 125 mg/dL is consistent with prediabetes and should be confirmed with a follow-up test. .   08/30/2015 95 65 - 99 mg/dL Final    Flowsheet Row Office Visit from 04/08/2021 in [facility]  AUDIT-C Score 0        Single STD testing and prevention (HIV/chl/gon/syphilis): 09/26/17 Hep C: 02/24/20  Skin cancer: Discussed monitoring for atypical lesions Colorectal cancer: N/A  Vaccines:   Pneumonia: educated and discussed with patient. Flu: educated and discussed with patient.  Advanced Care Planning: A voluntary discussion about advance care planning including the explanation and discussion of advance directives.  Discussed health care proxy and Living will, and the patient was able to identify a health care proxy as parents .   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 11/29/2015   Recurrent dislocation of right shoulder 07/16/2015   Verruca warts (infectious) 06/03/2015   Anxiety, generalized 02/25/2015   Insomnia 02/25/2015   Gastro-esophageal reflux disease without esophagitis 02/25/2015   Chronic recurrent major depressive disorder (HCC) 02/25/2015   Obesity (BMI 30.0-34.9) 02/25/2015   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   Perennial allergic rhinitis with seasonal variation 02/25/2015   Learning disability 02/25/2015    Past Surgical History:  Procedure Laterality Date   CIRCUMCISION  1991   SHOULDER ARTHROSCOPY Right 09/09/2015   Procedure: ARTHROSCOPY SHOULDER, RIGHT SHOULDER ATHROSCOPIC ANTERIOR AND POSTERIOR LABRAL TEAR REPAIR, CAPSULORRHAPHY;  Surgeon: Juanell Fairly, MD;  Location: [facility] ORS;  Service: Orthopedics;  Laterality: Right;    Family History  Problem Relation Age of Onset   Bipolar disorder Sister    Depression Brother    Hypertension Mother    Heart disease Mother    Depression Mother    Hypertension Father    Bipolar disorder Father    Hyperlipidemia Father    Obesity Father     Social History   Socioeconomic History   Marital status: Single    Spouse name: Not on file   Number of children: 0   Years of education: Not on file   Highest education level: Associate degree: academic program  Occupational History   Occupation: floor lead     Comment: [employer]   Tobacco Use   Smoking status: Never   Smokeless tobacco: Never  Substance and Sexual Activity   Alcohol use: No    Alcohol/week: 0.0 standard drinks   Drug use: No   Sexual activity: Not Currently  Other Topics Concern   Not on file  Social History Narrative   Not on  file   Social Determinants of Health   Financial Resource Strain: Low Risk    Difficulty of Paying Living Expenses: Not hard at all  Food Insecurity: No Food Insecurity   Worried About Programme researcher, broadcasting/film/video in the Last Year: Never true   Ran Out of Food in the Last Year: Never true  Transportation Needs: No Transportation Needs   Lack  of Transportation (Medical): No   Lack of Transportation (Non-Medical): No  Physical Activity: Sufficiently Active   Days of Exercise per Week: 6 days   Minutes of Exercise per Session: 60 min  Stress: Stress Concern Present   Feeling of Stress : To some extent  Social Connections: Socially Isolated   Frequency of Communication with Friends and Family: Never   Frequency of Social Gatherings with Friends and Family: More than three times a week   Attends Religious Services: Never   Database administrator or Organizations: No   Attends Engineer, structural: Never   Marital Status: Never married  Catering manager Violence: Not At Risk   Fear of Current or Ex-Partner: No   Emotionally Abused: No   Physically Abused: No   Sexually Abused: No     Current Outpatient Medications:    Cholecalciferol (VITAMIN D) 50 MCG (2000 UT) CAPS, Take 1 capsule (2,000 Units total) by mouth daily., Disp: 30 capsule, Rfl: 0   Melatonin 5 MG CAPS, Take by mouth., Disp: , Rfl:    PARoxetine (PAXIL) 40 MG tablet, Take 1 tablet (40 mg total) by mouth daily., Disp: 90 tablet, Rfl: 0   valsartan-hydrochlorothiazide (DIOVAN-HCT) 320-12.5 MG tablet, Take 1 tablet by mouth daily., Disp: 90 tablet, Rfl: 0  Allergies  Allergen Reactions   Other Swelling    YOGURT   Shellfish Allergy Hives     ROS  Constitutional: Negative for fever , positive for weight change.  Respiratory: Negative for cough and shortness of breath.   Cardiovascular: Negative for chest pain or palpitations.  Gastrointestinal: Negative for abdominal pain, no bowel changes.  Musculoskeletal:  Negative for gait problem or joint swelling.  Skin: Negative for rash.  Neurological: Negative for dizziness or headache.  No other specific complaints in a complete review of systems (except as listed in HPI above).    Objective  Vitals:   04/08/21 1326  BP: (!) 140/92  Pulse: 88  Resp: 16  Temp: 97.9 F (36.6 C)  TempSrc: Oral  SpO2: 98%  Weight: 246 lb (111.6 kg)  Height: 6' (1.829 m)    Body mass index is 33.36 kg/m.  Physical Exam  Constitutional: Patient appears well-developed and well-nourished. No distress.  HENT: Head: Normocephalic and atraumatic. Ears: B TMs ok, no erythema or effusion; Nose: Nose normal. Mouth/Throat: not done  Eyes: Conjunctivae and EOM are normal. Pupils are equal, round, and reactive to light. No scleral icterus.  Neck: Normal range of motion. Neck supple. No JVD present. No thyromegaly present.  Cardiovascular: Normal rate, regular rhythm and normal heart sounds.  No murmur heard. No BLE edema. Pulmonary/Chest: Effort normal and breath sounds normal. No respiratory distress. Abdominal: Soft. Bowel sounds are normal, no distension. There is no tenderness. no masses MALE GENITALIA: Normal descended testes bilaterally, no masses palpated, no hernias, no lesions, no discharge RECTAL: not done  Musculoskeletal: Normal range of motion, no joint effusions. No gross deformities Neurological: he is alert and oriented to person, place, and time. No cranial nerve deficit. Coordination, balance, strength, speech and gait are normal.  Skin: Skin is warm and dry. No rash noted. No erythema.  Psychiatric: Patient has a normal mood and affect. behavior is normal. Judgment and thought content normal.   Fall Risk: Fall Risk  04/08/2021 11/10/2020 08/10/2020 05/31/2020 02/24/2020  Falls in the past year? 0 0 0 0 0  Number falls in past yr: 0 0 0 0 0  Injury with Fall? 0 0  0 0 0  Follow up - - - - Falls evaluation completed     Functional Status Survey: Is  the patient deaf or have difficulty hearing?: No Does the patient have difficulty seeing, even when wearing glasses/contacts?: No Does the patient have difficulty concentrating, remembering, or making decisions?: No Does the patient have difficulty walking or climbing stairs?: No Does the patient have difficulty dressing or bathing?: No Does the patient have difficulty doing errands alone such as visiting a doctor's office or shopping?: No    Assessment & Plan  1. Well adult exam  - Lipid panel - Microalbumin / creatinine urine ratio - CBC with Differential/Platelet - COMPLETE METABOLIC PANEL WITH GFR - Hemoglobin A1c - VITAMIN D 25 Hydroxy (Vit-D Deficiency, Fractures)  2. Microalbuminuria  - Microalbumin / creatinine urine ratio  3. Benign hypertension with chronic kidney disease, stage I   4. Low vitamin D level  - VITAMIN D 25 Hydroxy (Vit-D Deficiency, Fractures)  5. Long-term use of high-risk medication  - CBC with Differential/Platelet - COMPLETE METABOLIC PANEL WITH GFR  6. Lipid screening  - Lipid panel  7. Diabetes mellitus screening  - Hemoglobin A1c     -Prostate cancer screening and PSA options (with potential risks and benefits of testing vs not testing) were discussed along with recent recs/guidelines. -USPSTF grade A and B recommendations reviewed with patient; age-appropriate recommendations, preventive care, screening tests, etc discussed and encouraged; healthy living encouraged; see AVS for patient education given to patient -Discussed importance of 150 minutes of physical activity weekly, eat two servings of fish weekly, eat one serving of tree nuts ( cashews, pistachios, pecans, almonds.Marland Kitchen.) every other day, eat 6 servings of fruit/vegetables daily and drink plenty of water and avoid sweet beverages.

## 2021-04-08 ENCOUNTER — Encounter: Payer: Self-pay | Admitting: Family Medicine

## 2021-04-08 ENCOUNTER — Ambulatory Visit (INDEPENDENT_AMBULATORY_CARE_PROVIDER_SITE_OTHER): Payer: PRIVATE HEALTH INSURANCE | Admitting: Family Medicine

## 2021-04-08 ENCOUNTER — Other Ambulatory Visit: Payer: Self-pay

## 2021-04-08 VITALS — BP 140/92 | HR 88 | Temp 97.9°F | Resp 16 | Ht 72.0 in | Wt 246.0 lb

## 2021-04-08 DIAGNOSIS — Z0000 Encounter for general adult medical examination without abnormal findings: Secondary | ICD-10-CM

## 2021-04-08 DIAGNOSIS — Z13220 Encounter for screening for lipoid disorders: Secondary | ICD-10-CM

## 2021-04-08 DIAGNOSIS — Z79899 Other long term (current) drug therapy: Secondary | ICD-10-CM

## 2021-04-08 DIAGNOSIS — R7989 Other specified abnormal findings of blood chemistry: Secondary | ICD-10-CM

## 2021-04-08 DIAGNOSIS — I129 Hypertensive chronic kidney disease with stage 1 through stage 4 chronic kidney disease, or unspecified chronic kidney disease: Secondary | ICD-10-CM

## 2021-04-08 DIAGNOSIS — R809 Proteinuria, unspecified: Secondary | ICD-10-CM

## 2021-04-08 DIAGNOSIS — Z131 Encounter for screening for diabetes mellitus: Secondary | ICD-10-CM

## 2021-04-08 DIAGNOSIS — N181 Chronic kidney disease, stage 1: Secondary | ICD-10-CM

## 2021-04-09 LAB — CBC WITH DIFFERENTIAL/PLATELET
Absolute Monocytes: 881 cells/uL (ref 200–950)
Basophils Absolute: 71 cells/uL (ref 0–200)
Basophils Relative: 0.8 %
Eosinophils Absolute: 205 cells/uL (ref 15–500)
Eosinophils Relative: 2.3 %
HCT: 46.5 % (ref 38.5–50.0)
Hemoglobin: 15.8 g/dL (ref 13.2–17.1)
Lymphs Abs: 2270 cells/uL (ref 850–3900)
MCH: 29.5 pg (ref 27.0–33.0)
MCHC: 34 g/dL (ref 32.0–36.0)
MCV: 86.9 fL (ref 80.0–100.0)
MPV: 8.9 fL (ref 7.5–12.5)
Monocytes Relative: 9.9 %
Neutro Abs: 5474 cells/uL (ref 1500–7800)
Neutrophils Relative %: 61.5 %
Platelets: 360 10*3/uL (ref 140–400)
RBC: 5.35 10*6/uL (ref 4.20–5.80)
RDW: 12 % (ref 11.0–15.0)
Total Lymphocyte: 25.5 %
WBC: 8.9 10*3/uL (ref 3.8–10.8)

## 2021-04-09 LAB — COMPLETE METABOLIC PANEL WITH GFR
AG Ratio: 1.7 (calc) (ref 1.0–2.5)
ALT: 29 U/L (ref 9–46)
AST: 17 U/L (ref 10–40)
Albumin: 4.6 g/dL (ref 3.6–5.1)
Alkaline phosphatase (APISO): 72 U/L (ref 36–130)
BUN/Creatinine Ratio: 22 (calc) (ref 6–22)
BUN: 27 mg/dL — ABNORMAL HIGH (ref 7–25)
CO2: 26 mmol/L (ref 20–32)
Calcium: 9.9 mg/dL (ref 8.6–10.3)
Chloride: 105 mmol/L (ref 98–110)
Creat: 1.21 mg/dL (ref 0.60–1.26)
Globulin: 2.7 g/dL (calc) (ref 1.9–3.7)
Glucose, Bld: 107 mg/dL — ABNORMAL HIGH (ref 65–99)
Potassium: 4.2 mmol/L (ref 3.5–5.3)
Sodium: 140 mmol/L (ref 135–146)
Total Bilirubin: 0.4 mg/dL (ref 0.2–1.2)
Total Protein: 7.3 g/dL (ref 6.1–8.1)
eGFR: 83 mL/min/{1.73_m2} (ref >=60)

## 2021-04-09 LAB — LIPID PANEL
Cholesterol: 143 mg/dL (ref <200)
HDL: 47 mg/dL (ref >=40)
LDL Cholesterol (Calc): 76 mg/dL (calc)
Non-HDL Cholesterol (Calc): 96 mg/dL (calc) (ref <130)
Total CHOL/HDL Ratio: 3 (calc) (ref <5.0)
Triglycerides: 122 mg/dL (ref <150)

## 2021-04-09 LAB — HEMOGLOBIN A1C
Hgb A1c MFr Bld: 5.2 % of total Hgb (ref <5.7)
Mean Plasma Glucose: 103 mg/dL
eAG (mmol/L): 5.7 mmol/L

## 2021-04-09 LAB — VITAMIN D 25 HYDROXY (VIT D DEFICIENCY, FRACTURES): Vit D, 25-Hydroxy: 16 ng/mL — ABNORMAL LOW (ref 30–100)

## 2021-04-12 NOTE — Progress Notes (Signed)
Name: Mason Parker   MRN: [id number]    DOB: 01/06/1991   Date:04/13/2021       Progress Note  Subjective  Chief Complaint  Follow Up  HPI  Obesity: he gained more weight since last visit, he is working 10 hours per day , 6 days a week. Eating breakfast at home, Atkins shake for lunch, mixed nuts , dinner at home. He is drinking mostly water now, very lightly sweeten tea. Try to exercise more outside of work    Major Depression and GAD: he had a gap in insurance and lost to follow up, however he has been working and is back on paxil since 05/2020 and we also added Seroquel but he felt  drowsy when he first gets up in am so he stopped medication . He works second shift and get home around 2-3 am, he is able to fall asleep around 6 am but has interrupted sleep, he has difficulty calming down and wakes up feeling tired. Phq 9 is stable, he has been taking paroxetine 40 mg daily , discussed switching medication but he states likely elevated because of recent stress at work but doing better now    HTN: BP has improved he is now on valsartan hctz 320/12.5 mg and since bp still towards high end of normal we will adjust dose to 320/25 mg . Denies chest pain , palpitation or sob. He is cutting down on salt intake. 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 11/29/2015   Recurrent dislocation of right shoulder  07/16/2015   Verruca warts (infectious) 06/03/2015   Anxiety, generalized 02/25/2015   Insomnia 02/25/2015   Gastro-esophageal reflux disease without esophagitis 02/25/2015   Chronic recurrent major depressive disorder ([place]) 02/25/2015   Obesity (BMI 30.0-34.9) 02/25/2015   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   Perennial allergic rhinitis with seasonal variation 02/25/2015   Learning disability 02/25/2015    Past Surgical History:  Procedure Laterality Date   CIRCUMCISION  1991   SHOULDER ARTHROSCOPY Right 09/09/2015   Procedure: ARTHROSCOPY SHOULDER, RIGHT SHOULDER ATHROSCOPIC ANTERIOR AND POSTERIOR LABRAL TEAR REPAIR, CAPSULORRHAPHY;  Surgeon: Thornton Park, MD;  Location: [facility] ORS;  Service: Orthopedics;  Laterality: Right;    Family History  Problem Relation Age of Onset   Bipolar disorder Sister    Depression Brother    Hypertension Mother    Heart disease Mother    Depression Mother    Hypertension Father    Bipolar disorder Father    Hyperlipidemia Father    Obesity Father     Social History   Tobacco Use   Smoking status: Never   Smokeless tobacco: Never  Substance Use Topics   Alcohol use: No    Alcohol/week: 0.0 standard drinks     Current Outpatient Medications:    Cholecalciferol (VITAMIN D) 50 MCG (2000 UT) CAPS, Take 1 capsule (2,000 Units total) by  mouth daily., Disp: 30 capsule, Rfl: 0   Melatonin 5 MG CAPS, Take by mouth., Disp: , Rfl:    valsartan-hydrochlorothiazide (DIOVAN-HCT) 320-25 MG tablet, Take 1 tablet by mouth daily., Disp: 90 tablet, Rfl: 1   Vitamin D, Ergocalciferol, (DRISDOL) 1.25 MG (50000 UNIT) CAPS capsule, Take 1 capsule (50,000 Units total) by mouth every 7 (seven) days., Disp: 12 capsule, Rfl: 0   PARoxetine (PAXIL) 40 MG tablet, Take 1 tablet (40 mg total) by mouth daily., Disp: 90 tablet, Rfl: 1  Allergies  Allergen Reactions   Other Swelling    YOGURT   Shellfish Allergy Hives    I personally reviewed active problem list,  medication list, allergies, family history, social history, health maintenance with the patient/caregiver today.   ROS  Constitutional: Negative for fever , positive for  weight change.  Respiratory: Negative for cough and shortness of breath.   Cardiovascular: Negative for chest pain or palpitations.  Gastrointestinal: Negative for abdominal pain, no bowel changes.  Musculoskeletal: Negative for gait problem or joint swelling.  Skin: Negative for rash.  Neurological: Negative for dizziness or headache.  No other specific complaints in a complete review of systems (except as listed in HPI above).   Objective  Vitals:   04/13/21 1310  BP: 136/88  Pulse: 98  Resp: 16  Temp: 98.1 F (36.7 C)  SpO2: 98%  Weight: 246 lb (111.6 kg)  Height: 6' (1.829 m)    Body mass index is 33.36 kg/m.  Physical Exam  Constitutional: Patient appears well-developed and well-nourished. Obese  No distress.  HEENT: head atraumatic, normocephalic, pupils equal and reactive to light, neck supple Cardiovascular: Normal rate, regular rhythm and normal heart sounds.  No murmur heard. No BLE edema. Pulmonary/Chest: Effort normal and breath sounds normal. No respiratory distress. Abdominal: Soft.  There is no tenderness. Psychiatric: Patient has a normal mood and affect. behavior is normal. Judgment and thought content normal.   Recent Results (from the past 2160 hour(s))  Lipid panel     Status: None   Collection Time: 04/08/21 12:00 AM  Result Value Ref Range   Cholesterol 143 <200 mg/dL   HDL 47 > OR = 40 mg/dL   Triglycerides 122 <150 mg/dL   LDL Cholesterol (Calc) 76 mg/dL (calc)    Comment: Reference range: <100 . Desirable range <100 mg/dL for primary prevention;   <70 mg/dL for patients with CHD or diabetic patients  with > or = 2 CHD risk factors. Marland Kitchen LDL-C is now calculated using the Martin-Hopkins  calculation, which is a validated novel method providing  better accuracy than the  Friedewald equation in the  estimation of LDL-C.  Cresenciano Genre et al. Annamaria Helling. 1610;960(45): 2061-2068  (http://education.[facility].com/faq/FAQ164)    Total CHOL/HDL Ratio 3.0 <5.0 (calc)   Non-HDL Cholesterol (Calc) 96 <130 mg/dL (calc)    Comment: For patients with diabetes plus 1 major ASCVD risk  factor, treating to a non-HDL-C goal of <100 mg/dL  (LDL-C of <70 mg/dL) is considered a therapeutic  option.   CBC with Differential/Platelet     Status: None   Collection Time: 04/08/21 12:00 AM  Result Value Ref Range   WBC 8.9 3.8 - 10.8 Thousand/uL   RBC 5.35 4.20 - 5.80 Million/uL   Hemoglobin 15.8 13.2 - 17.1 g/dL   HCT 46.5 38.5 - 50.0 %   MCV 86.9 80.0 - 100.0 fL   MCH 29.5 27.0 - 33.0 pg   MCHC 34.0 32.0 - 36.0 g/dL   RDW  12.0 11.0 - 15.0 %   Platelets 360 140 - 400 Thousand/uL   MPV 8.9 7.5 - 12.5 fL   Neutro Abs 5,474 1,500 - 7,800 cells/uL   Lymphs Abs 2,270 850 - 3,900 cells/uL   Absolute Monocytes 881 200 - 950 cells/uL   Eosinophils Absolute 205 15 - 500 cells/uL   Basophils Absolute 71 0 - 200 cells/uL   Neutrophils Relative % 61.5 %   Total Lymphocyte 25.5 %   Monocytes Relative 9.9 %   Eosinophils Relative 2.3 %   Basophils Relative 0.8 %  COMPLETE METABOLIC PANEL WITH GFR     Status: Abnormal   Collection Time: 04/08/21 12:00 AM  Result Value Ref Range   Glucose, Bld 107 (H) 65 - 99 mg/dL    Comment: .            Fasting reference interval . For someone without known diabetes, a glucose value between 100 and 125 mg/dL is consistent with prediabetes and should be confirmed with a follow-up test. .    BUN 27 (H) 7 - 25 mg/dL   Creat 1.21 0.60 - 1.26 mg/dL   eGFR 83 > OR = 60 mL/min/1.64m2    Comment: The eGFR is based on the CKD-EPI 2021 equation. To calculate  the new eGFR from a previous Creatinine or Cystatin C result, go to https://www.kidney.org/professionals/ kdoqi/gfr%5Fcalculator    BUN/Creatinine Ratio 22 6 - 22 (calc)   Sodium 140 135  - 146 mmol/L   Potassium 4.2 3.5 - 5.3 mmol/L   Chloride 105 98 - 110 mmol/L   CO2 26 20 - 32 mmol/L   Calcium 9.9 8.6 - 10.3 mg/dL   Total Protein 7.3 6.1 - 8.1 g/dL   Albumin 4.6 3.6 - 5.1 g/dL   Globulin 2.7 1.9 - 3.7 g/dL (calc)   AG Ratio 1.7 1.0 - 2.5 (calc)   Total Bilirubin 0.4 0.2 - 1.2 mg/dL   Alkaline phosphatase (APISO) 72 36 - 130 U/L   AST 17 10 - 40 U/L   ALT 29 9 - 46 U/L  Hemoglobin A1c     Status: None   Collection Time: 04/08/21 12:00 AM  Result Value Ref Range   Hgb A1c MFr Bld 5.2 <5.7 % of total Hgb    Comment: For the purpose of screening for the presence of diabetes: . <5.7%       Consistent with the absence of diabetes 5.7-6.4%    Consistent with increased risk for diabetes             (prediabetes) > or =6.5%  Consistent with diabetes . This assay result is consistent with a decreased risk of diabetes. . Currently, no consensus exists regarding use of hemoglobin A1c for diagnosis of diabetes in children. . According to American Diabetes Association (ADA) guidelines, hemoglobin A1c <7.0% represents optimal control in non-pregnant diabetic patients. Different metrics may apply to specific patient populations.  Standards of Medical Care in Diabetes(ADA). .    Mean Plasma Glucose 103 mg/dL   eAG (mmol/L) 5.7 mmol/L  VITAMIN D 25 Hydroxy (Vit-D Deficiency, Fractures)     Status: Abnormal   Collection Time: 04/08/21 12:00 AM  Result Value Ref Range   Vit D, 25-Hydroxy 16 (L) 30 - 100 ng/mL    Comment: Vitamin D Status         25-OH Vitamin D: . Deficiency:                    <20  ng/mL Insufficiency:             20 - 29 ng/mL Optimal:                 > or = 30 ng/mL . For 25-OH Vitamin D testing on patients on  D2-supplementation and patients for whom quantitation  of D2 and D3 fractions is required, the [facility](TM) 25-OH VIT D, (D2,D3), LC/MS/MS is recommended: order  code [phone] (patients >48yrs). See Note 1 . Note 1 . For additional  information, please refer to  http://education.[facility].com/faq/FAQ199  (This link is being provided for informational/ educational purposes only.)      PHQ2/9: Depression screen [facility] 2/9 04/13/2021 04/08/2021 11/10/2020 08/10/2020 05/31/2020  Decreased Interest 1 1 2 3 2   Down, Depressed, Hopeless 1 1 2 2 2   PHQ - 2 Score 2 2 4 5 4   Altered sleeping 3 3 3 3 3   Tired, decreased energy 3 3 3 3 2   Change in appetite 0 0 0 1 1  Feeling bad or failure about yourself  1 1 1 1 1   Trouble concentrating 0 0 0 2 3  Moving slowly or fidgety/restless 0 0 0 1 2  Suicidal thoughts 0 0 0 0 0  PHQ-9 Score 9 9 11 16 16   Difficult doing work/chores - - - Somewhat difficult Somewhat difficult  Some recent data might be hidden    phq 9 is positive   Fall Risk: Fall Risk  04/13/2021 04/08/2021 11/10/2020 08/10/2020 05/31/2020  Falls in the past year? 0 0 0 0 0  Number falls in past yr: 0 0 0 0 0  Injury with Fall? 0 0 0 0 0  Follow up - - - - -      Functional Status Survey: Is the patient deaf or have difficulty hearing?: No Does the patient have difficulty seeing, even when wearing glasses/contacts?: No Does the patient have difficulty concentrating, remembering, or making decisions?: No Does the patient have difficulty walking or climbing stairs?: No Does the patient have difficulty dressing or bathing?: No Does the patient have difficulty doing errands alone such as visiting a doctor's office or shopping?: No    Assessment & Plan  1. Benign hypertension with chronic kidney disease, stage I  - valsartan-hydrochlorothiazide (DIOVAN-HCT) 320-25 MG tablet; Take 1 tablet by mouth daily.  Dispense: 90 tablet; Refill: 1  2. Anxiety, generalized  - PARoxetine (PAXIL) 40 MG tablet; Take 1 tablet (40 mg total) by mouth daily.  Dispense: 90 tablet; Refill: 1  3. Moderate episode of recurrent major depressive disorder (HCC)  - PARoxetine (PAXIL) 40 MG tablet; Take 1 tablet (40 mg total)  by mouth daily.  Dispense: 90 tablet; Refill: 1  4. Low vitamin D level  - Vitamin D, Ergocalciferol, (DRISDOL) 1.25 MG (50000 UNIT) CAPS capsule; Take 1 capsule (50,000 Units total) by mouth every 7 (seven) days.  Dispense: 12 capsule; Refill: 0  5. Microalbuminuria   6. Obesity (BMI 30.0-34.9)   Discussed with the patient the risk posed by an increased BMI. Discussed importance of portion control, calorie counting and at least 150 minutes of physical activity weekly. Avoid sweet beverages and drink more water. Eat at least 6 servings of fruit and vegetables daily    7. Tinnitus of both ears  - Ambulatory referral to ENT

## 2021-04-13 ENCOUNTER — Other Ambulatory Visit: Payer: Self-pay

## 2021-04-13 ENCOUNTER — Encounter: Payer: Self-pay | Admitting: Family Medicine

## 2021-04-13 ENCOUNTER — Ambulatory Visit (INDEPENDENT_AMBULATORY_CARE_PROVIDER_SITE_OTHER): Payer: PRIVATE HEALTH INSURANCE | Admitting: Family Medicine

## 2021-04-13 VITALS — BP 136/88 | HR 98 | Temp 98.1°F | Resp 16 | Ht 72.0 in | Wt 246.0 lb

## 2021-04-13 DIAGNOSIS — F331 Major depressive disorder, recurrent, moderate: Secondary | ICD-10-CM

## 2021-04-13 DIAGNOSIS — I129 Hypertensive chronic kidney disease with stage 1 through stage 4 chronic kidney disease, or unspecified chronic kidney disease: Secondary | ICD-10-CM

## 2021-04-13 DIAGNOSIS — F411 Generalized anxiety disorder: Secondary | ICD-10-CM | POA: Diagnosis not present

## 2021-04-13 DIAGNOSIS — R7989 Other specified abnormal findings of blood chemistry: Secondary | ICD-10-CM | POA: Diagnosis not present

## 2021-04-13 DIAGNOSIS — E669 Obesity, unspecified: Secondary | ICD-10-CM

## 2021-04-13 DIAGNOSIS — H9313 Tinnitus, bilateral: Secondary | ICD-10-CM

## 2021-04-13 DIAGNOSIS — N181 Chronic kidney disease, stage 1: Secondary | ICD-10-CM

## 2021-04-13 DIAGNOSIS — R809 Proteinuria, unspecified: Secondary | ICD-10-CM

## 2021-04-13 MED ORDER — PAROXETINE HCL 40 MG PO TABS: 40.0000 mg | ORAL_TABLET | Freq: Every day | ORAL | 1 refills | Status: DC

## 2021-04-13 MED ORDER — VITAMIN D (ERGOCALCIFEROL) 1.25 MG (50000 UNIT) PO CAPS: 50000.0000 [IU] | ORAL_CAPSULE | ORAL | 0 refills | Status: DC

## 2021-04-13 MED ORDER — VALSARTAN-HYDROCHLOROTHIAZIDE 320-25 MG PO TABS
1.0000 | ORAL_TABLET | Freq: Every day | ORAL | 1 refills | Status: DC
Start: 2021-04-13 — End: 2021-04-26

## 2021-04-13 NOTE — Patient Instructions (Signed)
Check insurance Tell them your BMI is 33 and you have HTN , do they pay for weight loss medication, and what name?

## 2021-04-14 LAB — MICROALBUMIN / CREATININE URINE RATIO
Creatinine, Urine: 152 mg/dL (ref 20–320)
Microalb Creat Ratio: 9 mcg/mg creat (ref <30)
Microalb, Ur: 1.4 mg/dL

## 2021-04-26 ENCOUNTER — Encounter: Payer: Self-pay | Admitting: Family Medicine

## 2021-04-26 ENCOUNTER — Other Ambulatory Visit: Payer: Self-pay | Admitting: Family Medicine

## 2021-04-26 DIAGNOSIS — I129 Hypertensive chronic kidney disease with stage 1 through stage 4 chronic kidney disease, or unspecified chronic kidney disease: Secondary | ICD-10-CM

## 2021-04-26 MED ORDER — VALSARTAN-HYDROCHLOROTHIAZIDE 320-25 MG PO TABS: 1.0000 | ORAL_TABLET | Freq: Every day | ORAL | 1 refills | Status: DC

## 2021-07-13 ENCOUNTER — Other Ambulatory Visit: Payer: Self-pay

## 2021-07-13 ENCOUNTER — Ambulatory Visit (INDEPENDENT_AMBULATORY_CARE_PROVIDER_SITE_OTHER): Payer: PRIVATE HEALTH INSURANCE | Admitting: Family Medicine

## 2021-07-13 ENCOUNTER — Encounter: Payer: Self-pay | Admitting: Family Medicine

## 2021-07-13 VITALS — BP 126/84 | HR 66 | Temp 98.0°F | Resp 16 | Ht 72.0 in | Wt 249.0 lb

## 2021-07-13 DIAGNOSIS — N181 Chronic kidney disease, stage 1: Secondary | ICD-10-CM

## 2021-07-13 DIAGNOSIS — R809 Proteinuria, unspecified: Secondary | ICD-10-CM

## 2021-07-13 DIAGNOSIS — R7989 Other specified abnormal findings of blood chemistry: Secondary | ICD-10-CM

## 2021-07-13 DIAGNOSIS — I129 Hypertensive chronic kidney disease with stage 1 through stage 4 chronic kidney disease, or unspecified chronic kidney disease: Secondary | ICD-10-CM

## 2021-07-13 DIAGNOSIS — F330 Major depressive disorder, recurrent, mild: Secondary | ICD-10-CM | POA: Diagnosis not present

## 2021-07-13 DIAGNOSIS — Z23 Encounter for immunization: Secondary | ICD-10-CM | POA: Diagnosis not present

## 2021-07-13 DIAGNOSIS — H9313 Tinnitus, bilateral: Secondary | ICD-10-CM

## 2021-07-13 DIAGNOSIS — E669 Obesity, unspecified: Secondary | ICD-10-CM

## 2021-07-13 MED ORDER — VITAMIN D (ERGOCALCIFEROL) 1.25 MG (50000 UNIT) PO CAPS: 50000.0000 [IU] | ORAL_CAPSULE | ORAL | 0 refills | Status: DC

## 2021-07-13 NOTE — Progress Notes (Signed)
Name: Mason Parker   MRN: [id number]    DOB: August 02, 1991   Date:07/13/2021       Progress Note  Subjective  Chief Complaint  Follow Up  HPI  Obesity: he gained a few pounds since last visit, he is working 50-60 hours per week.  Recently moved in with his friend and also girlfriend and has been eating junk food. He is going to try improving his diet again    Major Depression and GAD: he had a gap in insurance and lost to follow up, however he has been working and is back on paxil since 05/2020 and we also added Seroquel but he felt  drowsy when he first gets up in am so he stopped medication . He works second shift and get home around 2-3 am, he is able to fall asleep around 6 am but has interrupted sleep, he has difficulty calming down and wakes up feeling tired. Phq 9 is stable, he has been taking paroxetine 40 mg daily , he recently reconnected with old girlfriend , moved out of his house because his brother moved back in and his mood was affecting everyone, he was constantly fighting with their father or with him.    HTN: BP is at goal with valsartan hctz  320/25 mg . Denies chest pain , palpitation or sob. He is cutting down on salt intake. He is active at work , CKI stage I, has microalbuminuria but last urine micro normal and also normal GFR    GERD: he states usually controlled with life style modification, he is taking prn otc medication   Tinnitus bilaterally: going on for years , worse on the right, we placed referral to ENT but he states never got a call, we will give him their number   Vitamin D deficiency: he has been taking vitamin D supplementation now   Patient Active Problem List   Diagnosis Date Noted   Moderate episode of recurrent major depressive disorder (HCC) 08/10/2020   Uncontrolled hypertension 08/10/2020   Tinnitus of both ears 08/10/2020   Benign hypertension with chronic kidney disease, stage I 08/10/2020   Low vitamin D level 08/10/2020   Aftercare  following right shoulder joint replacement surgery 11/29/2015   Recurrent dislocation of right shoulder 07/16/2015   Verruca warts (infectious) 06/03/2015   Anxiety, generalized 02/25/2015   Insomnia 02/25/2015   Gastro-esophageal reflux disease without esophagitis 02/25/2015   Chronic recurrent major depressive disorder (HCC) 02/25/2015   Obesity (BMI 30.0-34.9) 02/25/2015   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   Perennial allergic rhinitis with seasonal variation 02/25/2015   Learning disability 02/25/2015    Past Surgical History:  Procedure Laterality Date   CIRCUMCISION  1991   SHOULDER ARTHROSCOPY Right 09/09/2015   Procedure: ARTHROSCOPY SHOULDER, RIGHT SHOULDER ATHROSCOPIC ANTERIOR AND POSTERIOR LABRAL TEAR REPAIR, CAPSULORRHAPHY;  Surgeon: Juanell Fairly, MD;  Location: [facility] ORS;  Service: Orthopedics;  Laterality: Right;    Family History  Problem Relation Age of Onset   Bipolar disorder Sister    Depression Brother    Hypertension Mother    Heart disease Mother    Depression Mother    Hypertension Father    Bipolar disorder Father    Hyperlipidemia Father    Obesity Father     Social History   Tobacco Use   Smoking status: Never   Smokeless tobacco: Never  Substance Use Topics   Alcohol use: No    Alcohol/week: 0.0 standard drinks     Current Outpatient Medications:  Cholecalciferol (VITAMIN D) 50 MCG (2000 UT) CAPS, Take 1 capsule (2,000 Units total) by mouth daily., Disp: 30 capsule, Rfl: 0   Melatonin 5 MG CAPS, Take by mouth., Disp: , Rfl:    PARoxetine (PAXIL) 40 MG tablet, Take 1 tablet (40 mg total) by mouth daily., Disp: 90 tablet, Rfl: 1   valsartan-hydrochlorothiazide (DIOVAN-HCT) 320-25 MG tablet, Take 1 tablet by mouth daily., Disp: 90 tablet, Rfl: 1   Vitamin D, Ergocalciferol, (DRISDOL) 1.25 MG (50000 UNIT) CAPS capsule, Take 1 capsule (50,000 Units total) by mouth every 7 (seven) days., Disp: 12 capsule, Rfl: 0  Allergies  Allergen Reactions    Other Swelling    YOGURT   Shellfish Allergy Hives    I personally reviewed active problem list, medication list, allergies, family history, social history, health maintenance with the patient/caregiver today.   ROS  Constitutional: Negative for fever or significant weight change.  Respiratory: Negative for cough and shortness of breath.   Cardiovascular: Negative for chest pain or palpitations.  Gastrointestinal: Negative for abdominal pain, no bowel changes.  Musculoskeletal: Negative for gait problem or joint swelling.  Skin: Negative for rash.  Neurological: Negative for dizziness or headache.  No other specific complaints in a complete review of systems (except as listed in HPI above).   Objective  Vitals:   07/13/21 1308  BP: 126/84  Pulse: 66  Resp: 16  Temp: 98 F (36.7 C)  SpO2: 98%  Weight: 249 lb (112.9 kg)  Height: 6' (1.829 m)    Body mass index is 33.77 kg/m.  Physical Exam  Constitutional: Patient appears well-developed and well-nourished. Obese  No distress.  HEENT: head atraumatic, normocephalic, pupils equal and reactive to light, neck supple Cardiovascular: Normal rate, regular rhythm and normal heart sounds.  No murmur heard. No BLE edema. Pulmonary/Chest: Effort normal and breath sounds normal. No respiratory distress. Abdominal: Soft.  There is no tenderness. Psychiatric: Patient has a normal mood and affect. behavior is normal. Judgment and thought content normal.    PHQ2/9: Depression screen [facility] 2/9 07/13/2021 04/13/2021 04/08/2021 11/10/2020 08/10/2020  Decreased Interest 1 1 1 2 3   Down, Depressed, Hopeless 1 1 1 2 2   PHQ - 2 Score 2 2 2 4 5   Altered sleeping 0 3 3 3 3   Tired, decreased energy 3 3 3 3 3   Change in appetite 0 0 0 0 1  Feeling bad or failure about yourself  0 1 1 1 1   Trouble concentrating 0 0 0 0 2  Moving slowly or fidgety/restless 0 0 0 0 1  Suicidal thoughts 0 0 0 0 0  PHQ-9 Score 5 9 9 11 16   Difficult doing  work/chores - - - - Somewhat difficult  Some recent data might be hidden    phq 9 is positive   Fall Risk: Fall Risk  07/13/2021 04/13/2021 04/08/2021 11/10/2020 08/10/2020  Falls in the past year? 0 0 0 0 0  Number falls in past yr: 0 0 0 0 0  Injury with Fall? 0 0 0 0 0  Risk for fall due to : No Fall Risks - - - -  Follow up Falls prevention discussed - - - -     Functional Status Survey: Is the patient deaf or have difficulty hearing?: Yes Does the patient have difficulty seeing, even when wearing glasses/contacts?: No Does the patient have difficulty concentrating, remembering, or making decisions?: No Does the patient have difficulty walking or climbing stairs?: No Does the patient  have difficulty dressing or bathing?: No Does the patient have difficulty doing errands alone such as visiting a doctor's office or shopping?: No    Assessment & Plan  1. Mild episode of recurrent major depressive disorder (HCC)  Continue medication  2. Need for immunization against influenza  - Flu Vaccine QUAD 6+ mos PF IM (Fluarix Quad PF)  3. Benign hypertension with chronic kidney disease, stage I  Continue medication  4. Microalbuminuria  On ARB  5. Obesity (BMI 30.0-34.9)  Discussed with the patient the risk posed by an increased BMI. Discussed importance of portion control, calorie counting and at least 150 minutes of physical activity weekly. Avoid sweet beverages and drink more water. Eat at least 6 servings of fruit and vegetables daily    6. Low vitamin D level  - Vitamin D, Ergocalciferol, (DRISDOL) 1.25 MG (50000 UNIT) CAPS capsule; Take 1 capsule (50,000 Units total) by mouth every 7 (seven) days.  Dispense: 12 capsule; Refill: 0  7. Tinnitus of both ears  He will contact ENT  Referral has been sent to [facility]   P: [phone]

## 2021-07-13 NOTE — Patient Instructions (Signed)
Referral has been sent to [facility]   P: [phone]

## 2021-08-14 ENCOUNTER — Encounter: Payer: Self-pay | Admitting: Family Medicine

## 2021-08-16 ENCOUNTER — Encounter: Payer: Self-pay | Admitting: Family Medicine

## 2021-08-16 ENCOUNTER — Telehealth (INDEPENDENT_AMBULATORY_CARE_PROVIDER_SITE_OTHER): Payer: PRIVATE HEALTH INSURANCE | Admitting: Family Medicine

## 2021-08-16 VITALS — Ht 72.0 in | Wt 249.0 lb

## 2021-08-16 DIAGNOSIS — R0602 Shortness of breath: Secondary | ICD-10-CM

## 2021-08-16 MED ORDER — METHYLPREDNISOLONE 4 MG PO TBPK: ORAL_TABLET | ORAL | 0 refills | Status: DC

## 2021-08-16 MED ORDER — BENZONATATE 200 MG PO CAPS: 200.0000 mg | ORAL_CAPSULE | Freq: Two times a day (BID) | ORAL | 0 refills | Status: DC | PRN

## 2021-08-16 NOTE — Patient Instructions (Signed)
It was great to see you!  Our plans for today:  - Get your xray at [facility] ([address] [place]). You can also get this done at [facility]. We will release these results to your MyChart once available.  - Take the steroids as directed. - Take the cough medicine as needed.  - Certainly, if you have worsened breathing, chest pain, vomiting and can't keep fluids down, go to the hospital.  Take care and seek immediate care sooner if you develop any concerns.   Dr. Linwood Dibbles

## 2021-08-16 NOTE — Progress Notes (Signed)
Virtual Visit via Telephone Note  I connected with Artist Beach on 08/16/21 at 11:00 AM EST by telephone and verified that I am speaking with the correct person using two identifiers.  Location: Patient: home Provider: [facility]   I discussed the limitations, risks, security and privacy concerns of performing an evaluation and management service by telephone and the availability of in person appointments. I also discussed with the patient that there may be a patient responsible charge related to this service. The patient expressed understanding and agreed to proceed.   History of Present Illness:  UPPER RESPIRATORY TRACT INFECTION - symptom onset 4 days ago - COVID at home test negative yesterday.  Fever: yes Tmax 103.3F Cough: yes, green phlegm Shortness of breath: yes, at rest also Chest congestion: yes Nasal congestion: yes Runny nose: yes Sinus pressure: yes Headache: yes Vomiting: no Fatigue: yes Recurrent sinusitis: no Relief with OTC cold/cough medications: yes, a little Treatments attempted: nyquil, saline nasal spray, afrin, cough drops, vaporizer    Observations/Objective:  Patient had trouble connecting to video visit, entirety of visit conducted over the phone.  Speaks in full sentences, no respiratory distress.  Assessment and Plan:  VIRAL URI Moderate symptoms. COVID negative. Will provide medrol dose pack given minimal relief with OTC decongestant. Obtain chest XR given fever, shortness of breath, cough. Note provided for work. Reviewed  emergency precautions.      I discussed the assessment and treatment plan with the patient. The patient was provided an opportunity to ask questions and all were answered. The patient agreed with the plan and demonstrated an understanding of the instructions.   The patient was advised to call back or seek an in-person evaluation if the symptoms worsen or if the condition fails to improve as anticipated.  I provided 13  minutes of non-face-to-face time during this encounter.   Caro Laroche, DO

## 2021-08-18 ENCOUNTER — Ambulatory Visit (HOSPITAL_COMMUNITY)
Admission: RE | Admit: 2021-08-18 | Discharge: 2021-08-18 | Disposition: A | Discharge status: Home / Self Care | Payer: PRIVATE HEALTH INSURANCE | Source: Ambulatory Visit | Attending: Family Medicine | Admitting: Family Medicine

## 2021-08-18 ENCOUNTER — Other Ambulatory Visit: Payer: Self-pay

## 2021-08-18 DIAGNOSIS — R0602 Shortness of breath: Secondary | ICD-10-CM | POA: Insufficient documentation

## 2021-10-17 NOTE — Progress Notes (Signed)
Name: Mason Parker   MRN: [id number]    DOB: 04/02/1991   Date:10/18/2021       Progress Note  Subjective  Chief Complaint  Follow Up  HPI  Obesity: weight is increasing slightly.  He moved back with his parents, states his girlfriend left him again.    Major Depression and GAD: he had a gap in insurance and lost to follow up, however he has been working and is back on paxil since 05/2020 and we also added Seroquel but he felt  drowsy when he first gets up in am so he stopped medication . He works second shift and get home around 2-3 am, he is able to fall asleep around 6 am but has interrupted sleep, he has difficulty calming down and wakes up feeling tired. Phq 9 is high again. He has been taking paroxetine 40 mg daily. Since last visit he had to move back to his parents house since his girlfriend left him. He states he is trying to build a house so he can have his dog with him , he states his dog really helps with his depression and anxiety    HTN: BP is at goal with valsartan hctz  320/25 mg . Denies chest pain , palpitation or sob. He is cutting down on salt intake. He is active at work , CKI stage I, he has a history of microalbuminuria but last urine micro normal and also normal GFR    GERD: he states usually controlled with life style modification, however he states symptoms are daily now, taking Alka Seltzer plus daily to control symptoms. He states any type of food causes symptoms, heartburn , indigestion and regurgitation. No weight loss.   Tinnitus bilaterally: going on for years , worse on the right, we placed referral to ENT but he states never got a call, we gave him the number but he did not call them yet. Explained they may also help him with daily nasal congestion. He has nasal congestion that is constant even with otc medication, he states afrin helps, discussed risk of Afrin and we will add Flonase and Astelin   Vitamin D deficiency: he has been taking vitamin D otc now  and we will recheck next visit.   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 11/29/2015   Recurrent dislocation of right shoulder 07/16/2015   Verruca warts (infectious) 06/03/2015   Anxiety, generalized 02/25/2015   Insomnia 02/25/2015   Gastro-esophageal reflux disease without esophagitis 02/25/2015   Chronic recurrent major depressive disorder ([place]) 02/25/2015   Obesity (BMI 30.0-34.9) 02/25/2015   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   Perennial allergic rhinitis with seasonal variation 02/25/2015   Learning disability 02/25/2015    Past Surgical History:  Procedure Laterality Date   CIRCUMCISION  1991   SHOULDER ARTHROSCOPY Right 09/09/2015   Procedure: ARTHROSCOPY SHOULDER, RIGHT SHOULDER ATHROSCOPIC ANTERIOR AND POSTERIOR LABRAL TEAR REPAIR, CAPSULORRHAPHY;  Surgeon: Thornton Park, MD;  Location: [facility] ORS;  Service: Orthopedics;  Laterality: Right;    Family History  Problem Relation Age of Onset   Bipolar disorder Sister    Depression Brother    Hypertension Mother    Heart disease Mother    Depression Mother    Hypertension Father  Bipolar disorder Father    Hyperlipidemia Father    Obesity Father     Social History   Tobacco Use   Smoking status: Never   Smokeless tobacco: Never  Substance Use Topics   Alcohol use: No    Alcohol/week: 0.0 standard drinks     Current Outpatient Medications:    Cholecalciferol (VITAMIN D) 50 MCG (2000 UT) CAPS, Take 1 capsule by mouth daily., Disp: , Rfl:    Melatonin 5 MG CAPS, Take by mouth., Disp: , Rfl:    PARoxetine (PAXIL) 40 MG tablet, Take 1 tablet (40 mg total) by mouth daily., Disp: 90 tablet, Rfl: 1   valsartan-hydrochlorothiazide  (DIOVAN-HCT) 320-25 MG tablet, Take 1 tablet by mouth daily., Disp: 90 tablet, Rfl: 1  Allergies  Allergen Reactions   Other Swelling    YOGURT   Shellfish Allergy Hives    I personally reviewed active problem list, medication list, allergies, family history, social history, health maintenance with the patient/caregiver today.   ROS  Constitutional: Negative for fever or weight change.  Respiratory: Negative for cough and shortness of breath.   Cardiovascular: Negative for chest pain or palpitations.  Gastrointestinal: Negative for abdominal pain, no bowel changes.  Musculoskeletal: Negative for gait problem or joint swelling.  Skin: Negative for rash.  Neurological: Negative for dizziness or headache.  No other specific complaints in a complete review of systems (except as listed in HPI above).   Objective  Vitals:   10/18/21 1335  BP: 136/80  Pulse: 96  Resp: 16  SpO2: 99%  Weight: 253 lb (114.8 kg)  Height: 6' (1.829 m)    Body mass index is 34.31 kg/m.  Physical Exam  Constitutional: Patient appears well-developed and well-nourished. Obese  No distress.  HEENT: head atraumatic, normocephalic, pupils equal and reactive to light, neck supple Cardiovascular: Normal rate, regular rhythm and normal heart sounds.  No murmur heard. No BLE edema. Pulmonary/Chest: Effort normal and breath sounds normal. No respiratory distress. Abdominal: Soft.  There is no tenderness. Psychiatric: Patient has a normal mood and affect. behavior is normal. Judgment and thought content normal.   PHQ2/9: Depression screen [facility] 2/9 10/18/2021 08/16/2021 07/13/2021 04/13/2021 04/08/2021  Decreased Interest 3 0 1 1 1   Down, Depressed, Hopeless 2 0 1 1 1   PHQ - 2 Score 5 0 2 2 2   Altered sleeping 2 0 0 3 3  Tired, decreased energy 2 0 3 3 3   Change in appetite 0 0 0 0 0  Feeling bad or failure about yourself  1 0 0 1 1  Trouble concentrating 0 0 0 0 0  Moving slowly or fidgety/restless 0 0 0  0 0  Suicidal thoughts 0 0 0 0 0  PHQ-9 Score 10 0 5 9 9   Difficult doing work/chores - Not difficult at all - - -  Some recent data might be hidden    phq 9 is positive   Fall Risk: Fall Risk  10/18/2021 08/16/2021 07/13/2021 04/13/2021 04/08/2021  Falls in the past year? 0 0 0 0 0  Number falls in past yr: 0 0 0 0 0  Injury with Fall? 0 0 0 0 0  Risk for fall due to : No Fall Risks - No Fall Risks - -  Follow up Falls prevention discussed - Falls prevention discussed - -      Functional Status Survey: Is the patient deaf or have difficulty hearing?: No Does the patient have difficulty seeing, even when wearing glasses/contacts?:  No Does the patient have difficulty concentrating, remembering, or making decisions?: No Does the patient have difficulty walking or climbing stairs?: No Does the patient have difficulty dressing or bathing?: No Does the patient have difficulty doing errands alone such as visiting a doctor's office or shopping?: No    Assessment & Plan   1. Benign hypertension with chronic kidney disease, stage I  - valsartan-hydrochlorothiazide (DIOVAN-HCT) 320-25 MG tablet; Take 1 tablet by mouth daily.  Dispense: 90 tablet; Refill: 1  2. Anxiety, generalized  - PARoxetine (PAXIL) 40 MG tablet; Take 1 tablet (40 mg total) by mouth daily.  Dispense: 90 tablet; Refill: 1  3. Low vitamin D level  Continue supplementation  4. Chronic recurrent major depressive disorder (HCC)  - PARoxetine (PAXIL) 40 MG tablet; Take 1 tablet (40 mg total) by mouth daily.  Dispense: 90 tablet; Refill: 1  5. Perennial allergic rhinitis  - fluticasone (FLONASE) 50 MCG/ACT nasal spray; Place 2 sprays into both nostrils daily.  Dispense: 16 g; Refill: 6 - azelastine (ASTELIN) 0.1 % nasal spray; Place 2 sprays into both nostrils 2 (two) times daily. Use in each nostril as directed  Dispense: 30 mL; Refill: 2 - levocetirizine (XYZAL) 5 MG tablet; Take 1 tablet (5 mg total) by mouth  every evening.  Dispense: 90 tablet; Refill: 1  6. Gastroesophageal reflux disease without esophagitis  - omeprazole (PRILOSEC) 40 MG capsule; Take 1 capsule (40 mg total) by mouth daily.  Dispense: 90 capsule; Refill: 0

## 2021-10-18 ENCOUNTER — Encounter: Payer: Self-pay | Admitting: Family Medicine

## 2021-10-18 ENCOUNTER — Ambulatory Visit (INDEPENDENT_AMBULATORY_CARE_PROVIDER_SITE_OTHER): Payer: PRIVATE HEALTH INSURANCE | Admitting: Family Medicine

## 2021-10-18 VITALS — BP 136/80 | HR 96 | Resp 16 | Ht 72.0 in | Wt 253.0 lb

## 2021-10-18 DIAGNOSIS — F339 Major depressive disorder, recurrent, unspecified: Secondary | ICD-10-CM | POA: Diagnosis not present

## 2021-10-18 DIAGNOSIS — F411 Generalized anxiety disorder: Secondary | ICD-10-CM

## 2021-10-18 DIAGNOSIS — R7989 Other specified abnormal findings of blood chemistry: Secondary | ICD-10-CM

## 2021-10-18 DIAGNOSIS — K219 Gastro-esophageal reflux disease without esophagitis: Secondary | ICD-10-CM

## 2021-10-18 DIAGNOSIS — J3089 Other allergic rhinitis: Secondary | ICD-10-CM

## 2021-10-18 DIAGNOSIS — N181 Chronic kidney disease, stage 1: Secondary | ICD-10-CM

## 2021-10-18 DIAGNOSIS — I129 Hypertensive chronic kidney disease with stage 1 through stage 4 chronic kidney disease, or unspecified chronic kidney disease: Secondary | ICD-10-CM

## 2021-10-18 MED ORDER — LEVOCETIRIZINE DIHYDROCHLORIDE 5 MG PO TABS: 5.0000 mg | ORAL_TABLET | Freq: Every evening | ORAL | 1 refills | Status: DC

## 2021-10-18 MED ORDER — FLUTICASONE PROPIONATE 50 MCG/ACT NA SUSP: 2.0000 | Freq: Every day | NASAL | 6 refills | Status: DC

## 2021-10-18 MED ORDER — VALSARTAN-HYDROCHLOROTHIAZIDE 320-25 MG PO TABS: 1.0000 | ORAL_TABLET | Freq: Every day | ORAL | 1 refills | Status: DC

## 2021-10-18 MED ORDER — AZELASTINE HCL 0.1 % NA SOLN: 2.0000 | Freq: Two times a day (BID) | NASAL | 2 refills | Status: AC

## 2021-10-18 MED ORDER — PAROXETINE HCL 40 MG PO TABS: 40.0000 mg | ORAL_TABLET | Freq: Every day | ORAL | 1 refills | Status: DC

## 2021-10-18 MED ORDER — OMEPRAZOLE 40 MG PO CPDR: 40.0000 mg | DELAYED_RELEASE_CAPSULE | Freq: Every day | ORAL | 0 refills | Status: DC

## 2021-10-18 NOTE — Patient Instructions (Signed)
Food Choices for Gastroesophageal Reflux Disease, Adult °When you have gastroesophageal reflux disease (GERD), the foods you eat and your eating habits are very important. Choosing the right foods can help ease the discomfort of GERD. Consider working with a dietitian to help you make healthy food choices. °What are tips for following this plan? °Reading food labels °Look for foods that are low in saturated fat. Foods that have less than 5% of daily value (DV) of fat and 0 g of trans fats may help with your symptoms. °Cooking °Cook foods using methods other than frying. This may include baking, steaming, grilling, or broiling. These are all methods that do not need a lot of fat for cooking. °To add flavor, try to use herbs that are low in spice and acidity. °Meal planning ° °Choose healthy foods that are low in fat, such as fruits, vegetables, whole grains, low-fat dairy products, lean meats, fish, and poultry. °Eat frequent, small meals instead of three large meals each day. Eat your meals slowly, in a relaxed setting. Avoid bending over or lying down until 2-3 hours after eating. °Limit high-fat foods such as fatty meats or fried foods. °Limit your intake of fatty foods, such as oils, butter, and shortening. °Avoid the following as told by your health care provider: °Foods that cause symptoms. These may be different for different people. Keep a food diary to keep track of foods that cause symptoms. °Alcohol. °Drinking large amounts of liquid with meals. °Eating meals during the 2-3 hours before bed. °Lifestyle °Maintain a healthy weight. Ask your health care provider what weight is healthy for you. If you need to lose weight, work with your health care provider to do so safely. °Exercise for at least 30 minutes on 5 or more days each week, or as told by your health care provider. °Avoid wearing clothes that fit tightly around your waist and chest. °Do not use any products that contain nicotine or tobacco. These  products include cigarettes, chewing tobacco, and vaping devices, such as e-cigarettes. If you need help quitting, ask your health care provider. °Sleep with the head of your bed raised. Use a wedge under the mattress or blocks under the bed frame to raise the head of the bed. °Chew sugar-free gum after mealtimes. °What foods should I eat? °Eat a healthy, well-balanced diet of fruits, vegetables, whole grains, low-fat dairy products, lean meats, fish, and poultry. Each person is different. Foods that may trigger symptoms in one person may not trigger any symptoms in another person. Work with your health care provider to identify foods that are safe for you. °The items listed above may not be a complete list of recommended foods and beverages. Contact a dietitian for more information. °What foods should I avoid? °Limiting some of these foods may help manage the symptoms of GERD. Everyone is different. Consult a dietitian or your health care provider to help you identify the exact foods to avoid, if any. °Fruits °Any fruits prepared with added fat. Any fruits that cause symptoms. For some people this may include citrus fruits, such as oranges, grapefruit, pineapple, and lemons. °Vegetables °Deep-fried vegetables. French fries. Any vegetables prepared with added fat. Any vegetables that cause symptoms. For some people, this may include tomatoes and tomato products, chili peppers, onions and garlic, and horseradish. °Grains °Pastries or quick breads with added fat. °Meats and other proteins °High-fat meats, such as fatty beef or pork, hot dogs, ribs, ham, sausage, salami, and bacon. Fried meat or protein, including fried   fish and fried chicken. Nuts and nut butters, in large amounts. °Dairy °Whole milk and chocolate milk. Sour cream. Cream. Ice cream. Cream cheese. Milkshakes. °Fats and oils °Butter. Margarine. Shortening. Ghee. °Beverages °Coffee and tea, with or without caffeine. Carbonated beverages. Sodas. Energy  drinks. Fruit juice made with acidic fruits, such as orange or grapefruit. Tomato juice. Alcoholic drinks. °Sweets and desserts °Chocolate and cocoa. Donuts. °Seasonings and condiments °Pepper. Peppermint and spearmint. Added salt. Any condiments, herbs, or seasonings that cause symptoms. For some people, this may include curry, hot sauce, or vinegar-based salad dressings. °The items listed above may not be a complete list of foods and beverages to avoid. Contact a dietitian for more information. °Questions to ask your health care provider °Diet and lifestyle changes are usually the first steps that are taken to manage symptoms of GERD. If diet and lifestyle changes do not improve your symptoms, talk with your health care provider about taking medicines. °Where to find more information °International Foundation for Gastrointestinal Disorders: aboutgerd.org °Summary °When you have gastroesophageal reflux disease (GERD), food and lifestyle choices may be very helpful in easing the discomfort of GERD. °Eat frequent, small meals instead of three large meals each day. Eat your meals slowly, in a relaxed setting. Avoid bending over or lying down until 2-3 hours after eating. °Limit high-fat foods such as fatty meats or fried foods. °This information is not intended to replace advice given to you by your health care provider. Make sure you discuss any questions you have with your health care provider. °Document Revised: 03/15/2020 Document Reviewed: 03/15/2020 °Elsevier Patient Education © 2022 [employer] Inc. ° °

## 2022-01-18 ENCOUNTER — Encounter: Payer: Self-pay | Admitting: Family Medicine

## 2022-01-18 ENCOUNTER — Ambulatory Visit (INDEPENDENT_AMBULATORY_CARE_PROVIDER_SITE_OTHER): Payer: PRIVATE HEALTH INSURANCE | Admitting: Family Medicine

## 2022-01-18 VITALS — BP 142/86 | HR 96 | Temp 98.4°F | Resp 18 | Ht 72.0 in | Wt 248.0 lb

## 2022-01-18 DIAGNOSIS — E785 Hyperlipidemia, unspecified: Secondary | ICD-10-CM

## 2022-01-18 DIAGNOSIS — F339 Major depressive disorder, recurrent, unspecified: Secondary | ICD-10-CM

## 2022-01-18 DIAGNOSIS — I129 Hypertensive chronic kidney disease with stage 1 through stage 4 chronic kidney disease, or unspecified chronic kidney disease: Secondary | ICD-10-CM | POA: Diagnosis not present

## 2022-01-18 DIAGNOSIS — F411 Generalized anxiety disorder: Secondary | ICD-10-CM

## 2022-01-18 DIAGNOSIS — J3089 Other allergic rhinitis: Secondary | ICD-10-CM

## 2022-01-18 DIAGNOSIS — R809 Proteinuria, unspecified: Secondary | ICD-10-CM

## 2022-01-18 DIAGNOSIS — N181 Chronic kidney disease, stage 1: Secondary | ICD-10-CM

## 2022-01-18 DIAGNOSIS — R7989 Other specified abnormal findings of blood chemistry: Secondary | ICD-10-CM

## 2022-01-18 DIAGNOSIS — K219 Gastro-esophageal reflux disease without esophagitis: Secondary | ICD-10-CM | POA: Diagnosis not present

## 2022-01-18 DIAGNOSIS — E669 Obesity, unspecified: Secondary | ICD-10-CM

## 2022-01-18 DIAGNOSIS — E66811 Obesity, class 1: Secondary | ICD-10-CM

## 2022-01-18 MED ORDER — PAROXETINE HCL 20 MG PO TABS: 20.0000 mg | ORAL_TABLET | Freq: Every day | ORAL | 0 refills | Status: DC

## 2022-01-18 NOTE — Progress Notes (Signed)
Name: Mason Parker   MRN: [id number]    DOB: 1991-06-11   Date:01/18/2022 ? ?     Progress Note ? ?Subjective ? ?Chief Complaint ? ?Chief Complaint  ?Patient presents with  ? Follow-up  ? ? ?HPI ? ?Obesity: weight is slightly loss since last visit. He moved back with his parents, he is more depressed  ?  ?Major Depression and GAD: He has been working and resumed Paxil since 05/2020 and we also added Seroquel but he felt  drowsy when he first gets up in am so he stopped medication . He works second shift and get home around 2-3 am, he is able to fall asleep around 6 am but has interrupted sleep, he has difficulty calming down and wakes up feeling tired. Phq 9 is really high today He has been taking paroxetine 40 mg daily. 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 He is one of the leads and does not feel unsupported. It is affecting him emotionally due to being responsible to the productivity during night shift . He is trying to get another position He told me today he moved the location that he was placing Paxil and has not been taking it for the past two days. He states when taking 40 mg he feels numb so we will try going down to 20 mg dose  ?  ?HTN: he is on  valsartan hctz  320/25 mg . Denies chest pain , palpitation or sob. He is cutting down on salt intake. He is active at work , CKI stage I, he has a history of microalbuminuria but last urine micro normal and also normal GFR  His bp is not at goal today but he is under more stress. Discussed adding low dose Norvasc  but he states he has not been taking his medications over the past two weeks. Explained importance of resuming medication.  ?  ?GERD: symptoms used to be controlled with life style modification, however states symptoms became daily and was taking Alka Seltzer plus , last visit we gave him Omeprazole , initially he was taking it daily and  now he is able to skip days when he does not follow a GERD diet  ? ?Vitamin D deficiency: he has been taking vitamin D otc  ? ?Hyperlipidemia: discussed life style modification again with patient  ? ?AR: he has nasal congestion and post-nasal drainage he is staking his medications. , discussed adding singulair  ? ?Patient Active Problem List  ? Diagnosis Date Noted  ? Moderate episode of recurrent major depressive disorder ([place]) 08/10/2020  ? Tinnitus of both ears 08/10/2020  ? Benign hypertension with chronic kidney disease, stage I 08/10/2020  ? Low vitamin D level 08/10/2020  ? 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 Recurrent dislocation of right shoulder 07/16/2015  ? Verruca warts (infectious) 06/03/2015  ? Anxiety, generalized 02/25/2015  ? Insomnia 02/25/2015  ? Gastro-esophageal reflux disease without esophagitis 02/25/2015  ? Chronic recurrent major depressive disorder ([place]) 02/25/2015  ? Obesity (BMI 30.0-34.9) 02/25/2015  ? 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 Perennial allergic rhinitis with seasonal variation 02/25/2015  ? Learning disability 02/25/2015  ? ? ?Past Surgical History:  ?Procedure Laterality Date  ? CIRCUMCISION  1991  ? SHOULDER ARTHROSCOPY Right 09/09/2015  ? Procedure: ARTHROSCOPY SHOULDER, RIGHT SHOULDER ATHROSCOPIC ANTERIOR AND POSTERIOR LABRAL TEAR REPAIR, CAPSULORRHAPHY;  Surgeon:  Thornton Park, MD;  Location: [facility] ORS;  Service: Orthopedics;  Laterality: Right;  ? ? ?Family History  ?Problem Relation Age of Onset  ? Bipolar disorder Sister   ? Depression Brother   ? Hypertension Mother   ? Heart disease Mother   ? Depression Mother   ? Hypertension Father   ? Bipolar disorder Father   ? Hyperlipidemia Father   ? Obesity Father   ? ? ?Social History  ? ?Tobacco Use  ? Smoking status: Never  ? Smokeless tobacco: Never  ?Substance Use Topics  ? Alcohol use: No  ?  Alcohol/week: 0.0 standard drinks  ? ? ? ?Current Outpatient Medications:  ?  azelastine  (ASTELIN) 0.1 % nasal spray, Place 2 sprays into both nostrils 2 (two) times daily. Use in each nostril as directed, Disp: 30 mL, Rfl: 2 ?  Cholecalciferol (VITAMIN D) 50 MCG (2000 UT) CAPS, Take 1 capsule by mouth daily., Disp: , Rfl:  ?  fluticasone (FLONASE) 50 MCG/ACT nasal spray, Place 2 sprays into both nostrils daily., Disp: 16 g, Rfl: 6 ?  levocetirizine (XYZAL) 5 MG tablet, Take 1 tablet (5 mg total) by mouth every evening., Disp: 90 tablet, Rfl: 1 ?  Melatonin 5 MG CAPS, Take by mouth., Disp: , Rfl:  ?  omeprazole (PRILOSEC) 40 MG capsule, Take 1 capsule (40 mg total) by mouth daily., Disp: 90 capsule, Rfl: 0 ?  PARoxetine (PAXIL) 40 MG tablet, Take 1 tablet (40 mg total) by mouth daily., Disp: 90 tablet, Rfl: 1 ?  valsartan-hydrochlorothiazide (DIOVAN-HCT) 320-25 MG tablet, Take 1 tablet by mouth daily., Disp: 90 tablet, Rfl: 1 ? ?Allergies  ?Allergen Reactions  ? Other Swelling  ?  YOGURT  ? Shellfish Allergy Hives  ? ? ?I personally reviewed active problem list, medication list, allergies, family history, social history with the patient/caregiver today. ? ? ?ROS ? ? ?Constitutional: Negative for fever or weight change.  ?Respiratory: Negative for cough and shortness of breath.   ?Cardiovascular: Negative for chest pain or palpitations.  ?Gastrointestinal: Negative for abdominal pain, no bowel changes.  ?Musculoskeletal: Negative for gait problem or joint swelling.  ?Skin: Negative for rash.  ?Neurological: Negative for dizziness or headache.  ?No other specific complaints in a complete review of systems (except as listed in HPI above).  ? ?Objective ? ?Vitals:  ? 01/18/22 1320  ?BP: (!) 142/86  ?Pulse: 96  ?Resp: 18  ?Temp: 98.4 ?F (36.9 ?C)  ?TempSrc: Oral  ?SpO2: 98%  ?Weight: 248 lb (112.5 kg)  ?Height: 6' (1.829 m)  ? ? ?Body mass index is 33.63 kg/m?. ? ?Physical Exam ? ?Constitutional: Patient appears well-developed and well-nourished. Obese  No distress.  ?HEENT: head atraumatic, normocephalic,  pupils equal and reactive to light, ears normal TM bilaterally, neck supple, throat within normal limits ?Cardiovascular: Normal rate, regular rhythm and normal heart sounds.  No murmur heard. No BLE edema. ?Pulmonary/Chest: Effort normal and breath sounds normal. No respiratory distress. ?Abdominal: Soft.  There is no tenderness. ?Psychiatric: Patient has a normal mood and affect. behavior is normal. Judgment and thought content normal.  ? ?PHQ2/9: ? ?  01/18/2022  ?  1:27 PM 10/18/2021  ?  1:34 PM 08/16/2021  ? 10:36 AM 07/13/2021  ?  1:08 PM 04/13/2021  ?  1:07 PM  ?Depression screen PHQ 2/9  ?Decreased Interest 3 3 0 1 1  ?Down, Depressed, Hopeless 3 2 0 1 1  ?PHQ - 2 Score 6 5 0 2 2  ?Altered  sleeping 3 2 0 0 3  ?Tired, decreased energy 3 2 0 3 3  ?Change in appetite 1 0 0 0 0  ?Feeling bad or failure about yourself  3 1 0 0 1  ?Trouble concentrating 2 0 0 0 0  ?Moving slowly or fidgety/restless 2 0 0 0 0  ?Suicidal thoughts 0 0 0 0 0  ?PHQ-9 Score 20 10 0 5 9  ?Difficult doing work/chores Very difficult  Not difficult at all    ?  ?phq 9 is positive ? ? ?Fall Risk: ? ?  01/18/2022  ?  1:26 PM 10/18/2021  ?  1:34 PM 08/16/2021  ? 10:36 AM 07/13/2021  ?  1:08 PM 04/13/2021  ?  1:07 PM  ?Fall Risk   ?Falls in the past year? 0 0 0 0 0  ?Number falls in past yr:  0 0 0 0  ?Injury with Fall?  0 0 0 0  ?Risk for fall due to : No Fall Risks No Fall Risks  No Fall Risks   ?Follow up Falls prevention discussed Falls prevention discussed  Falls prevention discussed   ? ? ? ? ?Functional Status Survey: ?Is the patient deaf or have difficulty hearing?: No ?Does the patient have difficulty seeing, even when wearing glasses/contacts?: No ?Does the patient have difficulty concentrating, remembering, or making decisions?: Yes ?Does the patient have difficulty walking or climbing stairs?: No ?Does the patient have difficulty dressing or bathing?: No ?Does the patient have difficulty doing errands alone such as visiting a doctor's  office or shopping?: No ? ? ? ?Assessment & Plan ? ?1. Chronic recurrent major depressive disorder ([place]) ? ?- PARoxetine (PAXIL) 20 MG tablet; Take 1 tablet (20 mg total) by mouth daily.  Dispense: 90 tablet; Re

## 2022-04-20 NOTE — Progress Notes (Signed)
Name: Mason Parker   MRN: [id number]    DOB: Feb 06, 1991   Date:04/21/2022       Progress Note  Subjective  Chief Complaint  Follow Up  HPI  Obesity: weight is trending up again, he lives with parents, he has lack of motivation at home, he also has a more sedentary position He has been eating less carbs.    Major Depression and GAD: . He states when he was taking 40 mg he feels numb , we went down to 20 mg but he still could not feel any emotions so he stopped taking it months ago. He states he is able to focus and work but no energy or motivation when he is home. He has a new position at [employer] - it is less stressful .We will try Wellbutrin.     HTN: he is on  valsartan hctz  320/25 mg . Denies chest pain , palpitation or sob. He is cutting down on salt intake. HE is no longer  active at work , CKI stage I, he has a history of microalbuminuria but last urine micro normal and also normal GFR     GERD:he is currently taking Omeprazole prn only, depending on what he eats during the day, he will take a dose at night.   Vitamin D deficiency: he has been taking vitamin D otc   Hyperlipidemia: reminded him to eat healthy   AR: he has been using nasal spray and anti-histamines and is doing better. .   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   Perennial allergic rhinitis with seasonal variation 02/25/2015   Learning disability  02/25/2015    Past Surgical History:  Procedure Laterality Date   CIRCUMCISION  1991   SHOULDER ARTHROSCOPY Right 09/09/2015   Procedure: ARTHROSCOPY SHOULDER, RIGHT SHOULDER ATHROSCOPIC ANTERIOR AND POSTERIOR LABRAL TEAR REPAIR, CAPSULORRHAPHY;  Surgeon: Juanell Fairly, MD;  Location: [facility] ORS;  Service: Orthopedics;  Laterality: Right;    Family History  Problem Relation Age of Onset   Bipolar disorder Sister    Depression Brother    Hypertension Mother    Heart disease Mother    Depression Mother    Hypertension Father    Bipolar disorder Father    Hyperlipidemia Father    Obesity Father     Social History   Tobacco Use   Smoking status: Never   Smokeless tobacco: Never  Substance Use Topics   Alcohol use: No    Alcohol/week: 0.0 standard drinks of alcohol     Current Outpatient Medications:    azelastine (ASTELIN) 0.1 % nasal spray, Place 2 sprays into both nostrils 2 (two) times daily. Use in each nostril as directed, Disp: 30 mL, Rfl: 2   Cholecalciferol (VITAMIN D) 50 MCG (2000 UT) CAPS, Take 1 capsule by mouth daily., Disp: , Rfl:    fluticasone (FLONASE) 50 MCG/ACT nasal spray, Place 2 sprays into both nostrils daily., Disp: 16 g, Rfl: 6   levocetirizine (XYZAL) 5 MG  tablet, Take 1 tablet (5 mg total) by mouth every evening., Disp: 90 tablet, Rfl: 1   Melatonin 5 MG CAPS, Take by mouth., Disp: , Rfl:    omeprazole (PRILOSEC) 40 MG capsule, Take 1 capsule (40 mg total) by mouth daily., Disp: 90 capsule, Rfl: 0   valsartan-hydrochlorothiazide (DIOVAN-HCT) 320-25 MG tablet, Take 1 tablet by mouth daily., Disp: 90 tablet, Rfl: 1   PARoxetine (PAXIL) 20 MG tablet, Take 1 tablet (20 mg total) by mouth daily. (Patient not taking: Reported on 04/21/2022), Disp: 90 tablet, Rfl: 0  Allergies  Allergen Reactions   Other Swelling    YOGURT   Shellfish Allergy Hives    I personally reviewed active problem list, medication list, allergies, family history, social history,  health maintenance with the patient/caregiver today.   ROS  Ten systems reviewed and is negative except as mentioned in HPI  Objective  Vitals:   04/21/22 1404  BP: 134/86  Pulse: 97  Resp: 16  SpO2: 98%  Weight: 256 lb (116.1 kg)  Height: 6' (1.829 m)    Body mass index is 34.72 kg/m.  Physical Exam  Constitutional: Patient appears well-developed and well-nourished. Obese No distress.  HEENT: head atraumatic, normocephalic, pupils equal and reactive to light, neck supple Cardiovascular: Normal rate, regular rhythm and normal heart sounds.  No murmur heard. No BLE edema. Pulmonary/Chest: Effort normal and breath sounds normal. No respiratory distress. Abdominal: Soft.  There is no tenderness. Psychiatric: Patient has a normal mood and affect. behavior is normal. Judgment and thought content normal.    PHQ2/9:    04/21/2022    2:09 PM 01/18/2022    1:27 PM 10/18/2021    1:34 PM 08/16/2021   10:36 AM 07/13/2021    1:08 PM  Depression screen PHQ 2/9  Decreased Interest 1 3 3  0 1  Down, Depressed, Hopeless 1 3 2  0 1  PHQ - 2 Score 2 6 5  0 2  Altered sleeping 3 3 2  0 0  Tired, decreased energy 0 3 2 0 3  Change in appetite 0 1 0 0 0  Feeling bad or failure about yourself  0 3 1 0 0  Trouble concentrating 0 2 0 0 0  Moving slowly or fidgety/restless 0 2 0 0 0  Suicidal thoughts 0 0 0 0 0  PHQ-9 Score 5 20 10  0 5  Difficult doing work/chores  Very difficult  Not difficult at all     phq 9 is positive   Fall Risk:    04/21/2022    2:03 PM 01/18/2022    1:26 PM 10/18/2021    1:34 PM 08/16/2021   10:36 AM 07/13/2021    1:08 PM  Fall Risk   Falls in the past year? 0 0 0 0 0  Number falls in past yr: 0  0 0 0  Injury with Fall? 0  0 0 0  Risk for fall due to : No Fall Risks No Fall Risks No Fall Risks  No Fall Risks  Follow up Falls prevention discussed Falls prevention discussed Falls prevention discussed  Falls prevention discussed      Functional Status  Survey: Is the patient deaf or have difficulty hearing?: No Does the patient have difficulty seeing, even when wearing glasses/contacts?: No Does the patient have difficulty concentrating, remembering, or making decisions?: No Does the patient have difficulty walking or climbing stairs?: No Does the patient have difficulty dressing or bathing?: No Does the patient have difficulty doing errands  alone such as visiting a doctor's office or shopping?: No    Assessment & Plan  1. Chronic recurrent major depressive disorder (HCC)  - buPROPion (WELLBUTRIN XL) 150 MG 24 hr tablet; Take 1 tablet (150 mg total) by mouth daily.  Dispense: 90 tablet; Refill: 0  2. Benign hypertension with chronic kidney disease, stage I  - valsartan-hydrochlorothiazide (DIOVAN-HCT) 320-25 MG tablet; Take 1 tablet by mouth daily.  Dispense: 90 tablet; Refill: 1  3. Anxiety, generalized   4. Gastroesophageal reflux disease without esophagitis   5. Perennial allergic rhinitis   6. Dyslipidemia

## 2022-04-21 ENCOUNTER — Encounter: Payer: Self-pay | Admitting: Family Medicine

## 2022-04-21 ENCOUNTER — Ambulatory Visit (INDEPENDENT_AMBULATORY_CARE_PROVIDER_SITE_OTHER): Payer: PRIVATE HEALTH INSURANCE | Admitting: Family Medicine

## 2022-04-21 VITALS — BP 134/86 | HR 97 | Resp 16 | Ht 72.0 in | Wt 256.0 lb

## 2022-04-21 DIAGNOSIS — I129 Hypertensive chronic kidney disease with stage 1 through stage 4 chronic kidney disease, or unspecified chronic kidney disease: Secondary | ICD-10-CM | POA: Diagnosis not present

## 2022-04-21 DIAGNOSIS — E785 Hyperlipidemia, unspecified: Secondary | ICD-10-CM

## 2022-04-21 DIAGNOSIS — F339 Major depressive disorder, recurrent, unspecified: Secondary | ICD-10-CM

## 2022-04-21 DIAGNOSIS — F411 Generalized anxiety disorder: Secondary | ICD-10-CM | POA: Diagnosis not present

## 2022-04-21 DIAGNOSIS — N181 Chronic kidney disease, stage 1: Secondary | ICD-10-CM

## 2022-04-21 DIAGNOSIS — K219 Gastro-esophageal reflux disease without esophagitis: Secondary | ICD-10-CM | POA: Diagnosis not present

## 2022-04-21 DIAGNOSIS — J3089 Other allergic rhinitis: Secondary | ICD-10-CM

## 2022-04-21 MED ORDER — VALSARTAN-HYDROCHLOROTHIAZIDE 320-25 MG PO TABS: 1.0000 | ORAL_TABLET | Freq: Every day | ORAL | 1 refills | Status: DC

## 2022-04-21 MED ORDER — BUPROPION HCL ER (XL) 150 MG PO TB24: 150.0000 mg | ORAL_TABLET | Freq: Every day | ORAL | 0 refills | Status: DC

## 2022-06-28 ENCOUNTER — Other Ambulatory Visit: Payer: Self-pay | Admitting: Family Medicine

## 2022-06-28 DIAGNOSIS — F339 Major depressive disorder, recurrent, unspecified: Secondary | ICD-10-CM

## 2022-07-01 ENCOUNTER — Other Ambulatory Visit: Payer: Self-pay | Admitting: Family Medicine

## 2022-07-01 DIAGNOSIS — J3089 Other allergic rhinitis: Secondary | ICD-10-CM

## 2022-07-08 IMAGING — DX DG CHEST 2V
2 series · 2 of 2 positions shown · non-contrast
Comparison: None.

CLINICAL DATA: Shortness of breath, fever, and cough for 1 week.

EXAM:
CHEST - 2 VIEW

[chest pa]
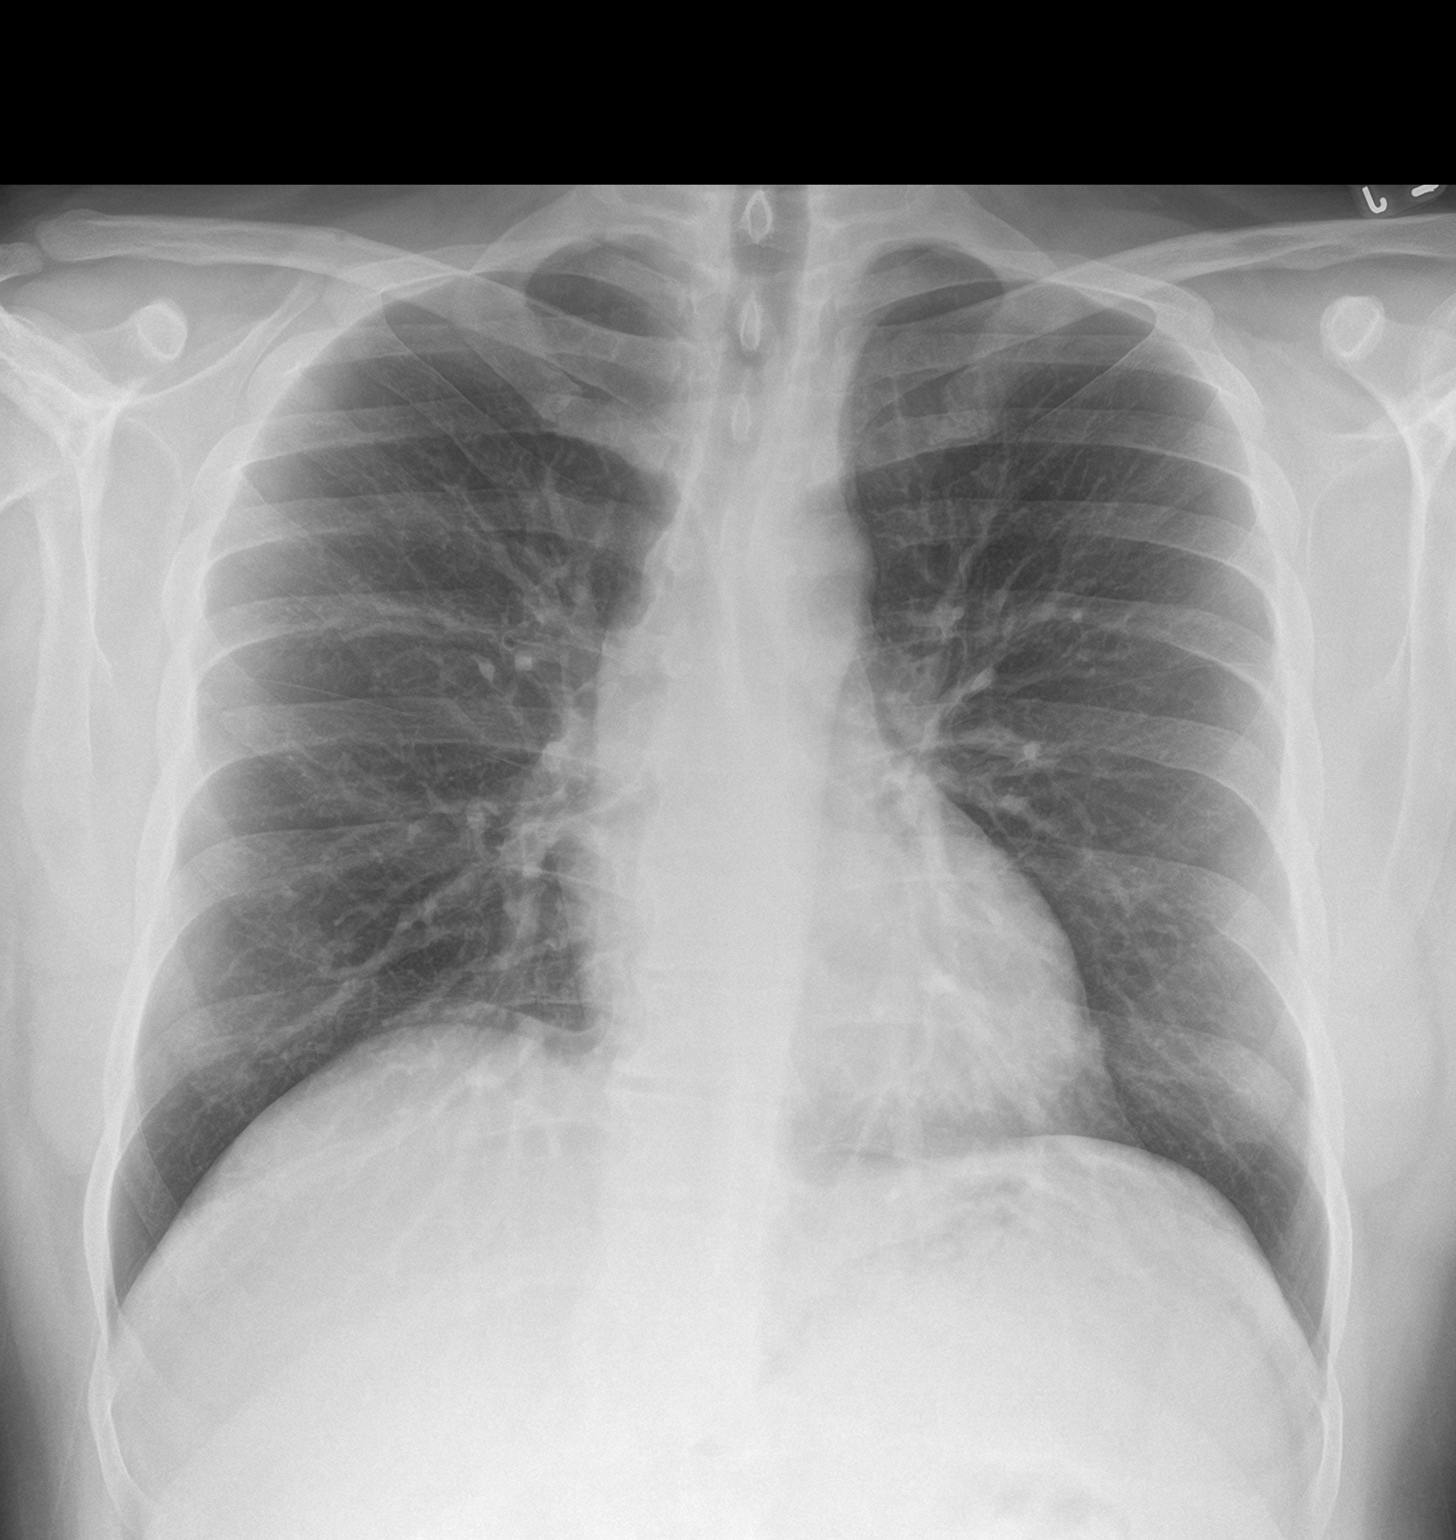

[chest lat]
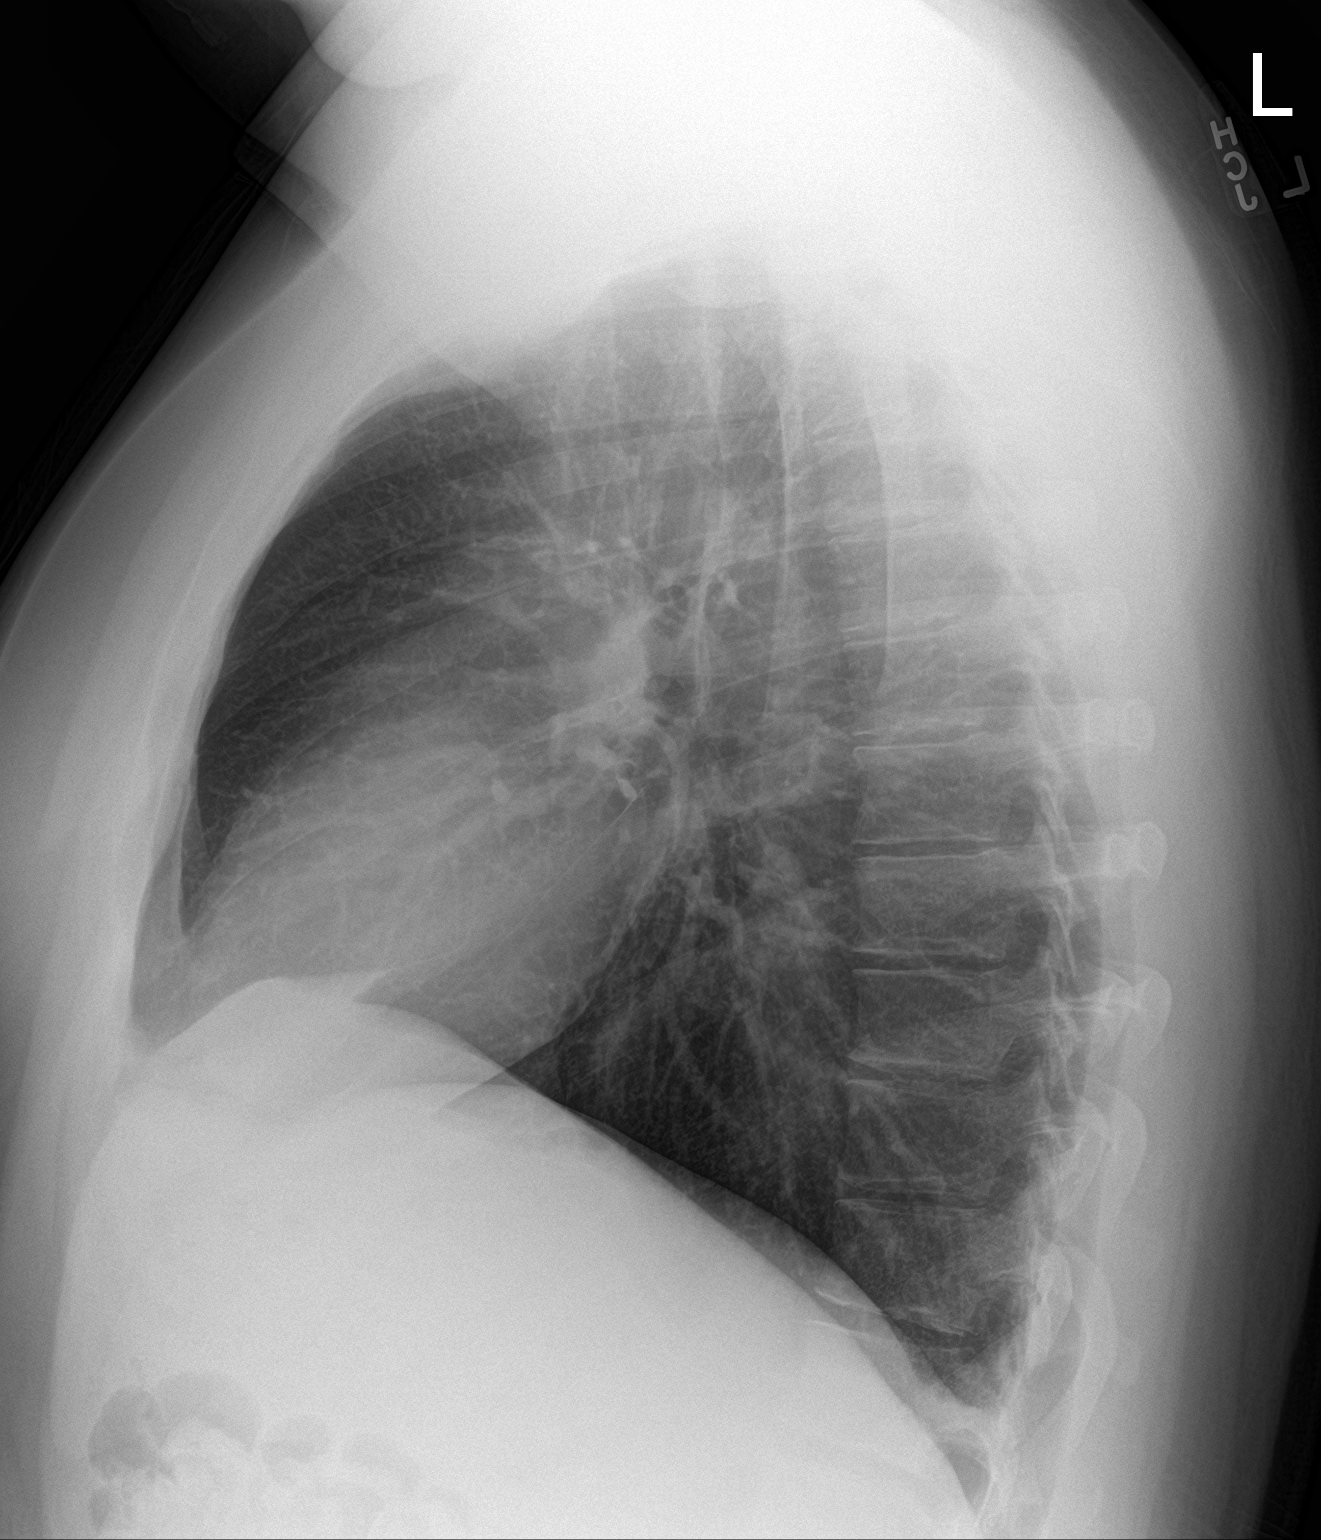

[2 of 2 positions shown; findings below may reference images not displayed]

FINDINGS: The heart size and mediastinal contours are within normal limits.
Both lungs are clear. The visualized skeletal structures are
unremarkable.
IMPRESSION: No active cardiopulmonary disease.

## 2022-07-24 NOTE — Progress Notes (Unsigned)
Name: Mason Parker   MRN: [id number]    DOB: [birth year]   Date:07/25/2022       Progress Note  Subjective  Chief Complaint  Follow up   HPI  Obesity: weight is stable, BMI is below 35, he states he will try to change his diet to more lean meat and cut down on carbohydrates    Major Depression and GAD: . He states when he was taking Paxil 40 mg he feels numb , we went down to 20 mg but he still could not feel any emotions and stopped it on his own, he is taking Wellbutrin and it helps with his energy level and more motivation, he still has anxiety and phq 9 is positive but prefers trying higher dose of wellbutrin before adding another medication at this time.   HTN: he is on  valsartan hctz  320/25 mg . Denies chest pain , palpitation or sob. He is cutting down on salt intake. He is no longer active at work , CKI stage I, he has a history of microalbuminuria but last urine micro normal and also normal GFR . BP is well controlled.    GERD:he is currently taking Omeprazole prn only, and doing well at this time   Vitamin D deficiency: he has been taking vitamin D otc , and prefers not repeating labs today   Hyperlipidemia: we will recheck level   AR: he has been using nasal spray and anti-histamines and is doing better.He has been also using saline spray otc to help with symptoms   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    Perennial allergic rhinitis with seasonal variation 02/25/2015   Learning disability 02/25/2015    Past Surgical History:  Procedure Laterality Date   CIRCUMCISION  1991   SHOULDER ARTHROSCOPY Right 09/09/2015   Procedure: ARTHROSCOPY SHOULDER, RIGHT SHOULDER ATHROSCOPIC ANTERIOR AND POSTERIOR LABRAL TEAR REPAIR, CAPSULORRHAPHY;  Surgeon: Juanell Fairly, MD;  Location: [facility] OR[facility];  Service: Orthopedics;  Laterality: Right;    Family History  Problem Relation Age of Onset   Bipolar disorder Sister    Depression Brother    Hypertension Mother    Heart disease Mother    Depression Mother    Hypertension Father    Bipolar disorder Father    Hyperlipidemia Father    Obesity Father     Social History   Tobacco Use   Smoking status: Never   Smokeless tobacco: Never  Substance Use Topics   Alcohol use: No    Alcohol/week: 0.0 standard drinks of alcohol     Current Outpatient Medications:    azelastine (ASTELIN) 0.1 % nasal spray, Place 2 sprays into both nostrils 2 (two) times daily. Use in each nostril as directed, Disp: 30 mL, Rfl: 2   buPROPion (WELLBUTRIN XL) 150 MG 24 hr tablet, Take 1 tablet (150 mg total) by mouth daily., Disp: 90 tablet, Rfl: 0   Cholecalciferol (VITAMIN D) 50 MCG (2000 UT) CAPS, Take 1 capsule by  mouth daily., Disp: , Rfl:    fluticasone (FLONASE) 50 MCG/ACT nasal spray, Place 2 sprays into both nostrils daily., Disp: 16 g, Rfl: 6   levocetirizine (XYZAL) 5 MG tablet, TAKE ONE TABLET BY MOUTH EVERY EVENING, Disp: 90 tablet, Rfl: 1   Melatonin 5 MG CAPS, Take by mouth., Disp: , Rfl:    omeprazole (PRILOSEC) 40 MG capsule, Take 1 capsule (40 mg total) by mouth daily., Disp: 90 capsule, Rfl: 0   valsartan-hydrochlorothiazide (DIOVAN-HCT) 320-25 MG tablet, Take 1 tablet by mouth daily., Disp: 90 tablet, Rfl: 1  Allergies  Allergen Reactions   Other Swelling    YOGURT   Shellfish Allergy Hives    I personally reviewed active problem list, medication  list, allergies, family history, social history, health maintenance with the patient/caregiver today.   ROS  Constitutional: Negative for fever or weight change.  Respiratory: Negative for cough and shortness of breath.   Cardiovascular: Negative for chest pain or palpitations.  Gastrointestinal: Negative for abdominal pain, no bowel changes.  Musculoskeletal: Negative for gait problem or joint swelling.  Skin: Negative for rash.  Neurological: Negative for dizziness or headache.  No other specific complaints in a complete review of systems (except as listed in HPI above).   Objective  Vitals:   07/25/22 1351  BP: 128/86  Pulse: (!) 105  Resp: 16  SpO2: 97%  Weight: 256 lb (116.1 kg)  Height: 6' (1.829 m)    Body mass index is 34.72 kg/m.  Physical Exam  Constitutional: Patient appears well-developed and well-nourished. Obese  No distress.  HEENT: head atraumatic, normocephalic, pupils equal and reactive to light, neck supple Cardiovascular: Normal rate, regular rhythm and normal heart sounds.  No murmur heard. No BLE edema. Pulmonary/Chest: Effort normal and breath sounds normal. No respiratory distress. Abdominal: Soft.  There is no tenderness. Psychiatric: Patient has a normal mood and affect. behavior is normal. Judgment and thought content normal.   PHQ2/9:    07/25/2022    1:50 PM 04/21/2022    2:09 PM 01/18/2022    1:27 PM 10/18/2021    1:34 PM 08/16/2021   10:36 AM  Depression screen PHQ 2/9  Decreased Interest 1 1 3 3  0  Down, Depressed, Hopeless 1 1 3 2  0  PHQ - 2 Score 2 2 6 5  0  Altered sleeping 3 3 3 2  0  Tired, decreased energy 0 0 3 2 0  Change in appetite 0 0 1 0 0  Feeling bad or failure about yourself  0 0 3 1 0  Trouble concentrating 3 0 2 0 0  Moving slowly or fidgety/restless 0 0 2 0 0  Suicidal thoughts 0 0 0 0 0  PHQ-9 Score 8 5 20 10  0  Difficult doing work/chores   Very difficult  Not difficult at all    phq 9 is positive     01/18/2022     1:28 PM 08/10/2020   11:38 AM 05/31/2020    1:36 PM 02/24/2020    9:03 AM  GAD 7 : Generalized Anxiety Score  Nervous, Anxious, on Edge 3 3 3 2   Control/stop worrying 3 3 3  0  Worry too much - different things 3 3 3 1   Trouble relaxing 3 3 3 3   Restless 3 1 3  0  Easily annoyed or irritable 1 1 3 1   Afraid - awful might happen 2 0 1 0  Total GAD 7 Score 18 14 19 7   Anxiety Difficulty Very difficult  Somewhat difficult Somewhat difficult Somewhat difficult       Fall Risk:    07/25/2022    1:50 PM 04/21/2022    2:03 PM 01/18/2022    1:26 PM 10/18/2021    1:34 PM 08/16/2021   10:36 AM  Fall Risk   Falls in the past year? 0 0 0 0 0  Number falls in past yr: 0 0  0 0  Injury with Fall? 0 0  0 0  Risk for fall due to : No Fall Risks No Fall Risks No Fall Risks No Fall Risks   Follow up Falls prevention discussed Falls prevention discussed Falls prevention discussed Falls prevention discussed       Functional Status Survey: Is the patient deaf or have difficulty hearing?: No Does the patient have difficulty seeing, even when wearing glasses/contacts?: No Does the patient have difficulty concentrating, remembering, or making decisions?: Yes Does the patient have difficulty walking or climbing stairs?: No Does the patient have difficulty dressing or bathing?: No Does the patient have difficulty doing errands alone such as visiting a doctor's office or shopping?: No    Assessment & Plan  1. Chronic recurrent major depressive disorder (HCC)  - buPROPion (WELLBUTRIN XL) 300 MG 24 hr tablet; Take 1 tablet (300 mg total) by mouth daily.  Dispense: 90 tablet; Refill: 0 - busPIRone (BUSPAR) 5 MG tablet; Take 1 tablet (5 mg total) by mouth daily as needed.  Dispense: 30 tablet; Refill: 0  2. Gastroesophageal reflux disease without esophagitis  Stable on prn PPI  3. Perennial allergic rhinitis   4. Benign hypertension with chronic kidney disease, stage I  Doing well now,  compliant  - COMPLETE METABOLIC PANEL WITH GFR  5. Anxiety, generalized  - busPIRone (BUSPAR) 5 MG tablet; Take 1 tablet (5 mg total) by mouth daily as needed.  Dispense: 30 tablet; Refill: 0  6. Dyslipidemia  - Lipid panel  7. Low vitamin D level   8. Microalbuminuria   9. Obesity (BMI 30.0-34.9)

## 2022-07-25 ENCOUNTER — Ambulatory Visit (INDEPENDENT_AMBULATORY_CARE_PROVIDER_SITE_OTHER): Payer: PRIVATE HEALTH INSURANCE | Admitting: Family Medicine

## 2022-07-25 ENCOUNTER — Encounter: Payer: Self-pay | Admitting: Family Medicine

## 2022-07-25 VITALS — BP 128/86 | HR 105 | Resp 16 | Ht 72.0 in | Wt 256.0 lb

## 2022-07-25 DIAGNOSIS — J3089 Other allergic rhinitis: Secondary | ICD-10-CM

## 2022-07-25 DIAGNOSIS — Z23 Encounter for immunization: Secondary | ICD-10-CM

## 2022-07-25 DIAGNOSIS — K219 Gastro-esophageal reflux disease without esophagitis: Secondary | ICD-10-CM | POA: Diagnosis not present

## 2022-07-25 DIAGNOSIS — F339 Major depressive disorder, recurrent, unspecified: Secondary | ICD-10-CM

## 2022-07-25 DIAGNOSIS — I129 Hypertensive chronic kidney disease with stage 1 through stage 4 chronic kidney disease, or unspecified chronic kidney disease: Secondary | ICD-10-CM

## 2022-07-25 DIAGNOSIS — E669 Obesity, unspecified: Secondary | ICD-10-CM

## 2022-07-25 DIAGNOSIS — R7989 Other specified abnormal findings of blood chemistry: Secondary | ICD-10-CM

## 2022-07-25 DIAGNOSIS — F411 Generalized anxiety disorder: Secondary | ICD-10-CM

## 2022-07-25 DIAGNOSIS — N181 Chronic kidney disease, stage 1: Secondary | ICD-10-CM

## 2022-07-25 DIAGNOSIS — R809 Proteinuria, unspecified: Secondary | ICD-10-CM

## 2022-07-25 DIAGNOSIS — E785 Hyperlipidemia, unspecified: Secondary | ICD-10-CM

## 2022-07-25 LAB — COMPLETE METABOLIC PANEL WITH GFR
AG Ratio: 1.7 (calc) (ref 1.0–2.5)
ALT: 34 U/L (ref 9–46)
AST: 18 U/L (ref 10–40)
Albumin: 4.9 g/dL (ref 3.6–5.1)
Alkaline phosphatase (APISO): 95 U/L (ref 36–130)
BUN/Creatinine Ratio: 13 (calc) (ref 6–22)
BUN: 17 mg/dL (ref 7–25)
CO2: 28 mmol/L (ref 20–32)
Calcium: 10.2 mg/dL (ref 8.6–10.3)
Chloride: 100 mmol/L (ref 98–110)
Creat: 1.27 mg/dL — ABNORMAL HIGH (ref 0.60–1.26)
Globulin: 2.9 g/dL (calc) (ref 1.9–3.7)
Glucose, Bld: 75 mg/dL (ref 65–99)
Potassium: 4.1 mmol/L (ref 3.5–5.3)
Sodium: 140 mmol/L (ref 135–146)
Total Bilirubin: 0.6 mg/dL (ref 0.2–1.2)
Total Protein: 7.8 g/dL (ref 6.1–8.1)
eGFR: 77 mL/min/{1.73_m2} (ref >=60)

## 2022-07-25 LAB — LIPID PANEL
Cholesterol: 166 mg/dL (ref <200)
HDL: 43 mg/dL (ref >=40)
LDL Cholesterol (Calc): 94 mg/dL (calc)
Non-HDL Cholesterol (Calc): 123 mg/dL (calc) (ref <130)
Total CHOL/HDL Ratio: 3.9 (calc) (ref <5.0)
Triglycerides: 197 mg/dL — ABNORMAL HIGH (ref <150)

## 2022-07-25 MED ORDER — BUPROPION HCL ER (XL) 300 MG PO TB24: 300.0000 mg | ORAL_TABLET | Freq: Every day | ORAL | 0 refills | Status: DC

## 2022-07-25 MED ORDER — BUSPIRONE HCL 5 MG PO TABS: 5.0000 mg | ORAL_TABLET | Freq: Every day | ORAL | 0 refills | Status: DC | PRN

## 2022-08-29 ENCOUNTER — Ambulatory Visit (INDEPENDENT_AMBULATORY_CARE_PROVIDER_SITE_OTHER): Payer: PRIVATE HEALTH INSURANCE | Admitting: Internal Medicine

## 2022-08-29 ENCOUNTER — Encounter: Payer: Self-pay | Admitting: Internal Medicine

## 2022-08-29 VITALS — BP 132/82 | HR 110 | Temp 98.8°F | Resp 16 | Ht 72.0 in | Wt 251.0 lb

## 2022-08-29 DIAGNOSIS — R6883 Chills (without fever): Secondary | ICD-10-CM

## 2022-08-29 DIAGNOSIS — R52 Pain, unspecified: Secondary | ICD-10-CM | POA: Diagnosis not present

## 2022-08-29 DIAGNOSIS — K529 Noninfective gastroenteritis and colitis, unspecified: Secondary | ICD-10-CM

## 2022-08-29 DIAGNOSIS — R112 Nausea with vomiting, unspecified: Secondary | ICD-10-CM | POA: Diagnosis not present

## 2022-08-29 LAB — POCT INFLUENZA A/B
Influenza A, POC: NEGATIVE
Influenza B, POC: NEGATIVE

## 2022-08-29 MED ORDER — PROMETHAZINE HCL 50 MG/ML IJ SOLN
25.0000 mg | Freq: Once | INTRAMUSCULAR | Status: AC
  Administered 2022-08-29: 25 mg via INTRAMUSCULAR

## 2022-08-29 MED ORDER — ONDANSETRON HCL 4 MG PO TABS: 4.0000 mg | ORAL_TABLET | Freq: Three times a day (TID) | ORAL | 0 refills | Status: DC | PRN

## 2022-08-29 NOTE — Progress Notes (Signed)
Acute Office Visit  Subjective:     Patient ID: Mason Parker, male    DOB: 03-29-1991, 31 y.o.   MRN: [id number]  Chief Complaint  Patient presents with   Generalized Body Aches   Chills   Diarrhea   Emesis    HPI Patient is in today for myalgias, fevers, diarrhea and nausea.  Patient had cold-like symptoms about 2 weeks ago but these resolved.  Last night he started having severe watery diarrhea accompanied with abdominal cramps.  He had about 7 episodes in the course of about 12 hours.  He denies blood in the stools.  He is also having nausea with 1 episode of vomiting.  He did have to leave work early today and needs a note.  He is also complaining of generalized abdominal pain.  He denies any urinary symptoms, no dysuria, hematuria.  He did note an elevated temperature of 100.0 yesterday.  He is also having bodyaches.  He denies sick contacts but did eat some noodles yesterday that were couple weeks cold.  Other than that he denies any other dietary changes.  He did have to take an over-the-counter antinausea medication but states it did not help.   Review of Systems  Constitutional:  Positive for chills and malaise/fatigue. Negative for fever.  Respiratory:  Negative for cough.   Cardiovascular:  Negative for chest pain.  Gastrointestinal:  Positive for abdominal pain, diarrhea, nausea and vomiting. Negative for blood in stool.  Genitourinary:  Negative for dysuria and hematuria.       Objective:    BP 132/82   Pulse (!) 110   Temp 98.8 F (37.1 C) (Oral)   Resp 16   Ht 6' (1.829 m)   Wt 251 lb (113.9 kg)   SpO2 98%   BMI 34.04 kg/m  BP Readings from Last 3 Encounters:  08/29/22 132/82  07/25/22 128/86  04/21/22 134/86   Wt Readings from Last 3 Encounters:  08/29/22 251 lb (113.9 kg)  07/25/22 256 lb (116.1 kg)  04/21/22 256 lb (116.1 kg)      Physical Exam Constitutional:      Appearance: Normal appearance.  HENT:     Head: Normocephalic and  atraumatic.     Mouth/Throat:     Mouth: Mucous membranes are moist.     Pharynx: Oropharynx is clear.  Eyes:     Conjunctiva/sclera: Conjunctivae normal.  Cardiovascular:     Rate and Rhythm: Normal rate and regular rhythm.  Pulmonary:     Effort: Pulmonary effort is normal.     Breath sounds: Normal breath sounds.  Abdominal:     General: Bowel sounds are normal. There is no distension.     Palpations: Abdomen is soft.     Tenderness: There is no abdominal tenderness. There is no right CVA tenderness, left CVA tenderness, guarding or rebound.  Skin:    General: Skin is warm and dry.  Neurological:     Mental Status: He is alert. Mental status is at baseline.  Psychiatric:        Mood and Affect: Mood normal.        Behavior: Behavior normal.     Results for orders placed or performed in visit on 08/29/22  POCT Influenza A/B  Result Value Ref Range   Influenza A, POC Negative Negative   Influenza B, POC Negative Negative        Assessment & Plan:   1. Gastroenteritis/Nausea and vomiting, unspecified vomiting type/Body aches/Chills: Symptoms consistent  with GI virus vs. Food poisoning.  Patient tested for flu in the office today which was negative.  Obtain stool cultures to rule out infectious cause.  Patient was given a prescription for Zofran to take as needed.  He was also given Phenergan injection to help with his acute nausea.  Patient will go home and rest, work note given for today and tomorrow.  Try to stay well-hydrated, eat bland foods.  Follow-up if symptoms worsen or fail to improve.  - ondansetron (ZOFRAN) 4 MG tablet; Take 1 tablet (4 mg total) by mouth every 8 (eight) hours as needed for nausea or vomiting.  Dispense: 20 tablet; Refill: 0 - Salmonella/Shigella Cult, Campy EIA and Shiga Toxin reflex - promethazine (PHENERGAN) injection 25 mg - POCT Influenza A/B   Return if symptoms worsen or fail to improve.  Dollene Primrose, CMA

## 2022-08-30 ENCOUNTER — Encounter: Payer: Self-pay | Admitting: Internal Medicine

## 2022-08-31 ENCOUNTER — Telehealth: Payer: Self-pay

## 2022-08-31 ENCOUNTER — Other Ambulatory Visit: Payer: Self-pay

## 2022-08-31 NOTE — Telephone Encounter (Signed)
Patient called to get an update on his out of work note. Patient states he would like to add Thursday and Friday's dates. Please follow up with patient.

## 2022-08-31 NOTE — Telephone Encounter (Signed)
Work note

## 2022-10-30 NOTE — Progress Notes (Unsigned)
Name: Mason Parker   MRN: [id number]    DOB: [birth year]   Date:10/31/2022       Progress Note  Subjective  Chief Complaint  Follow Up  HPI  Obesity: weight is stable, BMI is below 35, he started meal prepping lately, trying to lose more weight    Major Depression and GAD: . He states when he was taking Paxil 40 mg he feels numb , we went down to 20 mg but he still could not feel any emotions and stopped it on his own, he is taking Wellbutrin and it helps with his energy level and more motivation, he still has anxiety and phq 9 is still positive , taking high dose of Wellbutrin. He does not want to change medications at this time   HTN: he is on  valsartan hctz  320/25 mg . Denies chest pain , palpitation or sob. He is cutting down on salt intake. He is no longer active at work , CKI stage I, he has a history of microalbuminuria but last urine micro normal and also normal GFR . BP is a little elevated today, goal for him is below 130/80 , he is under more stress but he is willing to add a medication to control his bp , we will add atenolol since he has anxiety also    GERD:he is currently taking Omeprazole prn only and doing well.   Vitamin D deficiency: he has been taking vitamin D otc   Hyperlipidemia: last LDL was trending up, eats at home , however father doesn't always cook healthy    AR: he has been using nasal spray and anti-histamines and is doing better. He also use saline spray   Patient Active Problem List   Diagnosis Date Noted   Moderate episode of recurrent major depressive disorder ([place]) 08/10/2020   Tinnitus of both ears 08/10/2020   Benign hypertension with chronic kidney disease, stage I 08/10/2020   Low vitamin D level 08/10/2020   Recurrent dislocation of right shoulder 07/16/2015   Verruca warts (infectious) 06/03/2015   Anxiety, generalized 02/25/2015   Insomnia 02/25/2015   Gastro-esophageal reflux disease without esophagitis 02/25/2015   Chronic recurrent  major depressive disorder ([place]) 02/25/2015   Obesity (BMI 30.0-34.9) 02/25/2015   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   Perennial allergic rhinitis with seasonal variation 02/25/2015   Learning disability 02/25/2015    Past Surgical History:  Procedure Laterality Date   CIRCUMCISION  1991   SHOULDER ARTHROSCOPY Right 09/09/2015   Procedure: ARTHROSCOPY SHOULDER, RIGHT SHOULDER ATHROSCOPIC ANTERIOR AND POSTERIOR LABRAL TEAR REPAIR, CAPSULORRHAPHY;  Surgeon: Thornton Park, MD;  Location: [facility] ORS;  Service: Orthopedics;  Laterality: Right;    Family History  Problem Relation Age of Onset   Bipolar disorder Sister    Depression Brother    Hypertension Mother    Heart disease Mother    Depression Mother    Hypertension Father    Bipolar disorder Father    Hyperlipidemia Father    Obesity Father     Social History   Tobacco Use   Smoking status: Never   Smokeless tobacco: Never  Substance Use Topics   Alcohol use: No    Alcohol/week: 0.0 standard drinks of alcohol     Current Outpatient Medications:    azelastine (ASTELIN) 0.1 % nasal spray, Place 2 sprays into both nostrils 2 (two) times daily. Use in each nostril as directed, Disp: 30 mL, Rfl: 2   buPROPion (WELLBUTRIN XL) 300 MG 24 hr tablet, Take 1  tablet (300 mg total) by mouth daily., Disp: 90 tablet, Rfl: 0   busPIRone (BUSPAR) 5 MG tablet, Take 1 tablet (5 mg total) by mouth daily as needed., Disp: 30 tablet, Rfl: 0   Cholecalciferol (VITAMIN D) 50 MCG (2000 UT) CAPS, Take 1 capsule by mouth daily., Disp: , Rfl:    fluticasone (FLONASE) 50 MCG/ACT nasal spray, Place 2 sprays into both nostrils daily., Disp: 16 g, Rfl: 6   levocetirizine (XYZAL) 5 MG tablet, TAKE ONE TABLET BY MOUTH EVERY EVENING, Disp: 90 tablet, Rfl: 1   Melatonin 5 MG CAPS, Take by mouth., Disp: , Rfl:    omeprazole (PRILOSEC) 40 MG capsule, Take 1 capsule (40 mg total) by mouth daily., Disp: 90 capsule, Rfl: 0   ondansetron (ZOFRAN) 4 MG tablet, Take 1  tablet (4 mg total) by mouth every 8 (eight) hours as needed for nausea or vomiting., Disp: 20 tablet, Rfl: 0   valsartan-hydrochlorothiazide (DIOVAN-HCT) 320-25 MG tablet, Take 1 tablet by mouth daily., Disp: 90 tablet, Rfl: 1  Allergies  Allergen Reactions   Other Swelling    YOGURT   Shellfish Allergy Hives    I personally reviewed active problem list, medication list, allergies, family history, social history, health maintenance with the patient/caregiver today.   ROS  Constitutional: Negative for fever or weight change.  Respiratory: Negative for cough and shortness of breath.   Cardiovascular: Negative for chest pain or palpitations.  Gastrointestinal: Negative for abdominal pain, no bowel changes.  Musculoskeletal: Negative for gait problem or joint swelling.  Skin: Negative for rash.  Neurological: Negative for dizziness or headache.  No other specific complaints in a complete review of systems (except as listed in HPI above).   Objective  Vitals:   10/31/22 1458  BP: 138/86  Pulse: (!) 102  Resp: 16  SpO2: 98%  Weight: 250 lb (113.4 kg)  Height: 6' (1.829 m)    Body mass index is 33.91 kg/m.  Physical Exam  Constitutional: Patient appears well-developed and well-nourished. Obese  No distress.  HEENT: head atraumatic, normocephalic, pupils equal and reactive to light, neck supple Cardiovascular: Normal rate, regular rhythm and normal heart sounds.  No murmur heard. No BLE edema. Pulmonary/Chest: Effort normal and breath sounds normal. No respiratory distress. Abdominal: Soft.  There is no tenderness. Psychiatric: Patient has a normal mood and affect. behavior is normal. Judgment and thought content normal.   Recent Results (from the past 2160 hour(s))  POCT Influenza A/B     Status: None   Collection Time: 08/29/22  1:01 PM  Result Value Ref Range   Influenza A, POC Negative Negative   Influenza B, POC Negative Negative    PHQ2/9:    10/31/2022     2:58 PM 08/29/2022   12:57 PM 07/25/2022    1:50 PM 04/21/2022    2:09 PM 01/18/2022    1:27 PM  Depression screen PHQ 2/9  Decreased Interest 1 1 1 1 3  $ Down, Depressed, Hopeless 1 0 1 1 3  $ PHQ - 2 Score 2 1 2 2 6  $ Altered sleeping 3 3 3 3 3  $ Tired, decreased energy 3 3 0 0 3  Change in appetite 1 0 0 0 1  Feeling bad or failure about yourself  0 0 0 0 3  Trouble concentrating 1 1 3 $ 0 2  Moving slowly or fidgety/restless 0 0 0 0 2  Suicidal thoughts 0 0 0 0 0  PHQ-9 Score 10 8 8 5 $ 20  Difficult doing work/chores     Very difficult    phq 9 is positive     10/31/2022    3:01 PM 01/18/2022    1:28 PM 08/10/2020   11:38 AM 05/31/2020    1:36 PM  GAD 7 : Generalized Anxiety Score  Nervous, Anxious, on Edge 2 3 3 3  $ Control/stop worrying 2 3 3 3  $ Worry too much - different things 0 3 3 3  $ Trouble relaxing 3 3 3 3  $ Restless 0 3 1 3  $ Easily annoyed or irritable 1 1 1 3  $ Afraid - awful might happen 2 2 0 1  Total GAD 7 Score 10 18 14 19  $ Anxiety Difficulty  Very difficult Somewhat difficult Somewhat difficult     Fall Risk:    10/31/2022    2:57 PM 08/29/2022   12:50 PM 07/25/2022    1:50 PM 04/21/2022    2:03 PM 01/18/2022    1:26 PM  [place] in the past year? 0 0 0 0 0  Number falls in past yr: 0 0 0 0   Injury with Fall? 0 0 0 0   Risk for fall due to : No Fall Risks No Fall Risks No Fall Risks No Fall Risks No Fall Risks  Follow up Falls prevention discussed Falls prevention discussed Falls prevention discussed Falls prevention discussed Falls prevention discussed     Functional Status Survey: Is the patient deaf or have difficulty hearing?: No Does the patient have difficulty seeing, even when wearing glasses/contacts?: No Does the patient have difficulty concentrating, remembering, or making decisions?: No Does the patient have difficulty walking or climbing stairs?: No Does the patient have difficulty dressing or bathing?: No Does the patient have difficulty  doing errands alone such as visiting a doctor's office or shopping?: No    Assessment & Plan  1. Chronic recurrent major depressive disorder (HCC)  - buPROPion (WELLBUTRIN XL) 300 MG 24 hr tablet; Take 1 tablet (300 mg total) by mouth daily.  Dispense: 90 tablet; Refill: 1 - busPIRone (BUSPAR) 5 MG tablet; Take 1 tablet (5 mg total) by mouth daily as needed.  Dispense: 30 tablet; Refill: 0  2. Benign hypertension with chronic kidney disease, stage I  - atenolol (TENORMIN) 25 MG tablet; Take 1 tablet (25 mg total) by mouth daily.  Dispense: 90 tablet; Refill: 1 - valsartan-hydrochlorothiazide (DIOVAN-HCT) 320-25 MG tablet; Take 1 tablet by mouth daily.  Dispense: 90 tablet; Refill: 1  3. Gastroesophageal reflux disease without esophagitis  - omeprazole (PRILOSEC) 40 MG capsule; Take 1 capsule (40 mg total) by mouth daily.  Dispense: 90 capsule; Refill: 1  4. Anxiety, generalized  - atenolol (TENORMIN) 25 MG tablet; Take 1 tablet (25 mg total) by mouth daily.  Dispense: 90 tablet; Refill: 1 - busPIRone (BUSPAR) 5 MG tablet; Take 1 tablet (5 mg total) by mouth daily as needed.  Dispense: 30 tablet; Refill: 0  5. Dyslipidemia   6. Low vitamin D level   7. Perennial allergic rhinitis

## 2022-10-31 ENCOUNTER — Ambulatory Visit (INDEPENDENT_AMBULATORY_CARE_PROVIDER_SITE_OTHER): Payer: PRIVATE HEALTH INSURANCE | Admitting: Family Medicine

## 2022-10-31 ENCOUNTER — Encounter: Payer: Self-pay | Admitting: Family Medicine

## 2022-10-31 VITALS — BP 138/86 | HR 102 | Resp 16 | Ht 72.0 in | Wt 250.0 lb

## 2022-10-31 DIAGNOSIS — R7989 Other specified abnormal findings of blood chemistry: Secondary | ICD-10-CM

## 2022-10-31 DIAGNOSIS — E785 Hyperlipidemia, unspecified: Secondary | ICD-10-CM

## 2022-10-31 DIAGNOSIS — F339 Major depressive disorder, recurrent, unspecified: Secondary | ICD-10-CM | POA: Diagnosis not present

## 2022-10-31 DIAGNOSIS — J3089 Other allergic rhinitis: Secondary | ICD-10-CM

## 2022-10-31 DIAGNOSIS — F411 Generalized anxiety disorder: Secondary | ICD-10-CM | POA: Diagnosis not present

## 2022-10-31 DIAGNOSIS — N181 Chronic kidney disease, stage 1: Secondary | ICD-10-CM

## 2022-10-31 DIAGNOSIS — K219 Gastro-esophageal reflux disease without esophagitis: Secondary | ICD-10-CM

## 2022-10-31 DIAGNOSIS — I129 Hypertensive chronic kidney disease with stage 1 through stage 4 chronic kidney disease, or unspecified chronic kidney disease: Secondary | ICD-10-CM

## 2022-10-31 MED ORDER — ATENOLOL 25 MG PO TABS: 25.0000 mg | ORAL_TABLET | Freq: Every day | ORAL | 1 refills | Status: DC

## 2022-10-31 MED ORDER — VALSARTAN-HYDROCHLOROTHIAZIDE 320-25 MG PO TABS: 1.0000 | ORAL_TABLET | Freq: Every day | ORAL | 1 refills | Status: DC

## 2022-10-31 MED ORDER — BUSPIRONE HCL 5 MG PO TABS: 5.0000 mg | ORAL_TABLET | Freq: Every day | ORAL | 0 refills | Status: DC | PRN

## 2022-10-31 MED ORDER — OMEPRAZOLE 40 MG PO CPDR: 40.0000 mg | DELAYED_RELEASE_CAPSULE | Freq: Every day | ORAL | 1 refills | Status: DC

## 2022-10-31 MED ORDER — BUPROPION HCL ER (XL) 300 MG PO TB24: 300.0000 mg | ORAL_TABLET | Freq: Every day | ORAL | 1 refills | Status: DC

## 2023-01-29 NOTE — Progress Notes (Unsigned)
Name: Mason Parker   MRN: [id number]    DOB: 12/23/1990   Date:01/30/2023       Progress Note  Subjective  Chief Complaint  Follow Up  HPI  Obesity: weight is stable, BMI is below 35, he lost a lot of weight recently but gradually gained it back due to lack of motivation    Major Depression and GAD: . He states when he was taking Paxil 40 mg he feels numb , we went down to 20 mg but he still could not feel any emotions and stopped it on his own, he is taking Wellbutrin and it helps with his energy level and more motivation, he still has anxiety and phq 9 is still positive , taking high dose of Wellbutrin. We will try adding lexapro and refer him to psychiatrist. Symptoms are ongoing and not doing well   HTN: he is on  valsartan hctz  320/25 mg . Denies chest pain , palpitation or sob. He has CKI stage I, he has a history of microalbuminuria but last urine micro normal and also normal GFR . BP still not great but just left work    GERD:he is currently taking Omeprazole every night lately and symptoms are controlled   Vitamin D deficiency: he has been taking vitamin D otc   Hyperlipidemia: last LDL was trending up, eats at home , however father doesn't always cook healthy  We will recheck it yearly   AR: he has been using nasal spray and anti-histamines and is stable   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   Perennial  allergic rhinitis with seasonal variation 02/25/2015   Learning disability 02/25/2015    Past Surgical History:  Procedure Laterality Date   CIRCUMCISION  1991   SHOULDER ARTHROSCOPY Right 09/09/2015   Procedure: ARTHROSCOPY SHOULDER, RIGHT SHOULDER ATHROSCOPIC ANTERIOR AND POSTERIOR LABRAL TEAR REPAIR, CAPSULORRHAPHY;  Surgeon: Juanell Fairly, MD;  Location: [facility];  Service: Orthopedics;  Laterality: Right;    Family History  Problem Relation Age of Onset   Bipolar disorder Sister    Depression Brother    Hypertension Mother    Heart disease Mother    Depression Mother    Hypertension Father    Bipolar disorder Father    Hyperlipidemia Father    Obesity Father     Social History   Tobacco Use   Smoking status: Never   Smokeless tobacco: Never  Substance Use Topics   Alcohol use: No    Alcohol/week: 0.0 standard drinks of alcohol     Current Outpatient Medications:    atenolol (TENORMIN) 25 MG tablet, Take 1 tablet (25 mg total) by mouth daily., Disp: 90 tablet, Rfl: 1   azelastine (ASTELIN) 0.1 % nasal spray, Place 2 sprays into both nostrils 2 (two) times daily. Use in each nostril as directed, Disp: 30 mL, Rfl: 2   buPROPion (WELLBUTRIN XL) 300 MG 24 hr tablet, Take 1 tablet (300 mg total) by mouth daily., Disp: 90  tablet, Rfl: 1   busPIRone (BUSPAR) 5 MG tablet, Take 1 tablet (5 mg total) by mouth daily as needed., Disp: 30 tablet, Rfl: 0   Cholecalciferol (VITAMIN D) 50 MCG (2000 UT) CAPS, Take 1 capsule by mouth daily., Disp: , Rfl:    fluticasone (FLONASE) 50 MCG/ACT nasal spray, Place 2 sprays into both nostrils daily., Disp: 16 g, Rfl: 6   levocetirizine (XYZAL) 5 MG tablet, TAKE ONE TABLET BY MOUTH EVERY EVENING, Disp: 90 tablet, Rfl: 1   Melatonin 5 MG CAPS, Take by mouth., Disp: , Rfl:    omeprazole (PRILOSEC) 40 MG capsule, Take 1 capsule (40 mg total) by mouth daily., Disp: 90 capsule, Rfl: 1   valsartan-hydrochlorothiazide (DIOVAN-HCT) 320-25 MG tablet,  Take 1 tablet by mouth daily., Disp: 90 tablet, Rfl: 1  Allergies  Allergen Reactions   Other Swelling    YOGURT   Shellfish Allergy Hives    I personally reviewed active problem list, medication list, allergies, family history, social history, health maintenance with the patient/caregiver today.   ROS  Constitutional: Negative for fever or weight change.  Respiratory: Negative for cough and shortness of breath.   Cardiovascular: Negative for chest pain or palpitations.  Gastrointestinal: Negative for abdominal pain, no bowel changes.  Musculoskeletal: Negative for gait problem or joint swelling.  Skin: Negative for rash.  Neurological: Negative for dizziness or headache.  No other specific complaints in a complete review of systems (except as listed in HPI above).   Objective  Vitals:   01/30/23 1504  BP: 136/84  Pulse: 83  Resp: 16  SpO2: 98%  Weight: 252 lb (114.3 kg)  Height: 6' (1.829 m)    Body mass index is 34.18 kg/m.  Physical Exam  Constitutional: Patient appears well-developed and well-nourished. Obese  No distress.  HEENT: head atraumatic, normocephalic, pupils equal and reactive to light, neck supple, Cardiovascular: Normal rate, regular rhythm and normal heart sounds.  No murmur heard. No BLE edema. Pulmonary/Chest: Effort normal and breath sounds normal. No respiratory distress. Abdominal: Soft.  There is no tenderness. Psychiatric: Patient has a normal mood and affect. behavior is normal. Judgment and thought content normal.    PHQ2/9:    01/30/2023    3:03 PM 10/31/2022    2:58 PM 08/29/2022   12:57 PM 07/25/2022    1:50 PM 04/21/2022    2:09 PM  Depression screen PHQ 2/9  Decreased Interest 3 1 1 1 1   Down, Depressed, Hopeless 2 1 0 1 1  PHQ - 2 Score 5 2 1 2 2   Altered sleeping 3 3 3 3 3   Tired, decreased energy 3 3 3  0 0  Change in appetite 1 1 0 0 0  Feeling bad or failure about yourself  2 0 0 0 0  Trouble concentrating 3 1 1 3  0   Moving slowly or fidgety/restless 0 0 0 0 0  Suicidal thoughts 0 0 0 0 0  PHQ-9 Score 17 10 8 8 5     phq 9 is positive   Fall Risk:    01/30/2023    3:03 PM 10/31/2022    2:57 PM 08/29/2022   12:50 PM 07/25/2022    1:50 PM 04/21/2022    2:03 PM  Fall Risk   Falls in the past year? 0 0 0 0 0  Number falls in past yr: 0 0 0 0 0  Injury with Fall? 0 0 0 0 0  Risk for fall due to : No Fall  Risks No Fall Risks No Fall Risks No Fall Risks No Fall Risks  Follow up Falls prevention discussed Falls prevention discussed Falls prevention discussed Falls prevention discussed Falls prevention discussed      Functional Status Survey: Is the patient deaf or have difficulty hearing?: No Does the patient have difficulty seeing, even when wearing glasses/contacts?: No Does the patient have difficulty concentrating, remembering, or making decisions?: No Does the patient have difficulty walking or climbing stairs?: No Does the patient have difficulty dressing or bathing?: No Does the patient have difficulty doing errands alone such as visiting a doctor's office or shopping?: No    Assessment & Plan  1. Chronic recurrent major depressive disorder (HCC)  - Ambulatory referral to Psychiatry - escitalopram (LEXAPRO) 5 MG tablet; Take 1 tablet (5 mg total) by mouth daily.  Dispense: 90 tablet; Refill: 0  2. Benign hypertension with chronic kidney disease, stage I  We will adjust dose next visit if bp still above 130/80   3. Gastroesophageal reflux disease without esophagitis  Getting worse, needs to take it daily  4. Dyslipidemia  On life style modification  5. Anxiety, generalized   6. Low vitamin D level  Continue supplementation

## 2023-01-30 ENCOUNTER — Encounter: Payer: Self-pay | Admitting: Family Medicine

## 2023-01-30 ENCOUNTER — Ambulatory Visit (INDEPENDENT_AMBULATORY_CARE_PROVIDER_SITE_OTHER): Payer: PRIVATE HEALTH INSURANCE | Admitting: Family Medicine

## 2023-01-30 VITALS — BP 136/84 | HR 83 | Resp 16 | Ht 72.0 in | Wt 252.0 lb

## 2023-01-30 DIAGNOSIS — R7989 Other specified abnormal findings of blood chemistry: Secondary | ICD-10-CM

## 2023-01-30 DIAGNOSIS — K219 Gastro-esophageal reflux disease without esophagitis: Secondary | ICD-10-CM

## 2023-01-30 DIAGNOSIS — E785 Hyperlipidemia, unspecified: Secondary | ICD-10-CM | POA: Diagnosis not present

## 2023-01-30 DIAGNOSIS — F339 Major depressive disorder, recurrent, unspecified: Secondary | ICD-10-CM

## 2023-01-30 DIAGNOSIS — I129 Hypertensive chronic kidney disease with stage 1 through stage 4 chronic kidney disease, or unspecified chronic kidney disease: Secondary | ICD-10-CM | POA: Diagnosis not present

## 2023-01-30 DIAGNOSIS — N181 Chronic kidney disease, stage 1: Secondary | ICD-10-CM

## 2023-01-30 DIAGNOSIS — F411 Generalized anxiety disorder: Secondary | ICD-10-CM

## 2023-01-30 MED ORDER — ESCITALOPRAM OXALATE 5 MG PO TABS
5.0000 mg | ORAL_TABLET | Freq: Every day | ORAL | 0 refills | Status: DC
Start: 2023-01-30 — End: 2023-04-26

## 2023-01-30 NOTE — Patient Instructions (Signed)
Lexapro 5 mg #90 call and let me know if other place is cheaper than [facility]

## 2023-04-25 ENCOUNTER — Encounter: Payer: Self-pay | Admitting: Family Medicine

## 2023-04-26 ENCOUNTER — Other Ambulatory Visit: Payer: Self-pay

## 2023-04-26 DIAGNOSIS — F339 Major depressive disorder, recurrent, unspecified: Secondary | ICD-10-CM

## 2023-04-26 NOTE — Telephone Encounter (Signed)
Teed up

## 2023-04-26 NOTE — Telephone Encounter (Signed)
Pt requesting through MyChart message

## 2023-04-27 MED ORDER — ESCITALOPRAM OXALATE 5 MG PO TABS
5.0000 mg | ORAL_TABLET | Freq: Every day | ORAL | 0 refills | Status: DC
Start: 2023-04-27 — End: 2023-05-04

## 2023-05-03 NOTE — Progress Notes (Signed)
Name: Mason Parker   MRN: [id number]    DOB: 1991/07/12   Date:05/04/2023       Progress Note  Subjective  Chief Complaint  Follow Up  HPI  Obesity: weight is stable, he was very depressed and not taking his medications, he states was not moving and eating more , however last week he had a better day, decided to resume taking all medications and has noticed motivation to eat healthier again.    Major Depression and GAD: . He states when he was taking Paxil 40 mg he felt numb , we went down to 20 mg but he still could not feel any emotions and stopped it on his own, he was on  Wellbutrin and it helps with his energy level and more motivation,however anxiety was very high, we added Lexapro 5 mg but he only resumed taking last week. 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 He states feeling better since last week, back on medication. We will let him finish the 5 mg dose and send a rx for 10 mg dose, he will return in 3 months or sooner for follow up  HTN: he is on  valsartan hctz  320/25 mg and Atenolol  . Denies chest pain , palpitation or sob. He has CKI stage I, he has a history of microalbuminuria but last urine micro normal and also normal GFR .BP today is good, he states that he just resumed taking medication last week, when he went to the dentist BP was very hight ( without taking meds)    GERD:he is currently taking Omeprazole prn only and symptoms are controlled   Vitamin D deficiency: he has been taking vitamin D otc - re-started last week   Hyperlipidemia: last LDL was trending up, eats at home , however father doesn't always cook healthy all the time.   AR: he has been using nasal spray and anti-histamines and unchanged   Patient Active Problem List   Diagnosis Date Noted   Moderate episode of recurrent major depressive disorder (HCC) 08/10/2020    Tinnitus of both ears 08/10/2020   Benign hypertension with chronic kidney disease, stage I 08/10/2020   Low vitamin D level 08/10/2020   Recurrent dislocation of right shoulder 07/16/2015   Verruca warts (infectious) 06/03/2015   Anxiety, generalized 02/25/2015   Insomnia 02/25/2015   Gastro-esophageal reflux disease without esophagitis 02/25/2015   Chronic recurrent major depressive disorder (HCC) 02/25/2015   Obesity (BMI 30.0-34.9) 02/25/2015   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   Perennial allergic rhinitis with seasonal variation 02/25/2015   Learning disability 02/25/2015    Past Surgical History:  Procedure Laterality Date   CIRCUMCISION  1991   SHOULDER ARTHROSCOPY Right 09/09/2015   Procedure: ARTHROSCOPY SHOULDER, RIGHT SHOULDER ATHROSCOPIC ANTERIOR AND POSTERIOR LABRAL TEAR REPAIR, CAPSULORRHAPHY;  Surgeon: Juanell Fairly, MD;  Location: [facility] ORS;  Service: Orthopedics;  Laterality: Right;    Family History  Problem Relation Age of Onset   Bipolar disorder Sister    Depression Brother    Hypertension Mother    Heart disease Mother    Depression Mother    Hypertension Father    Bipolar disorder Father    Hyperlipidemia Father    Obesity Father     Social History   Tobacco Use   Smoking status: Never   Smokeless tobacco: Never  Substance Use Topics  Alcohol use: No    Alcohol/week: 0.0 standard drinks of alcohol     Current Outpatient Medications:    atenolol (TENORMIN) 25 MG tablet, Take 1 tablet (25 mg total) by mouth daily., Disp: 90 tablet, Rfl: 1   azelastine (ASTELIN) 0.1 % nasal spray, Place 2 sprays into both nostrils 2 (two) times daily. Use in each nostril as directed, Disp: 30 mL, Rfl: 2   buPROPion (WELLBUTRIN XL) 300 MG 24 hr tablet, Take 1 tablet (300 mg total) by mouth daily., Disp: 90 tablet, Rfl: 1   busPIRone (BUSPAR) 5 MG tablet, Take 1 tablet (5 mg total) by mouth daily as needed., Disp: 30 tablet, Rfl: 0   Cholecalciferol (VITAMIN D) 50 MCG  (2000 UT) CAPS, Take 1 capsule by mouth daily., Disp: , Rfl:    escitalopram (LEXAPRO) 5 MG tablet, Take 1 tablet (5 mg total) by mouth daily., Disp: 30 tablet, Rfl: 0   fluticasone (FLONASE) 50 MCG/ACT nasal spray, Place 2 sprays into both nostrils daily., Disp: 16 g, Rfl: 6   levocetirizine (XYZAL) 5 MG tablet, TAKE ONE TABLET BY MOUTH EVERY EVENING, Disp: 90 tablet, Rfl: 1   Melatonin 5 MG CAPS, Take by mouth., Disp: , Rfl:    omeprazole (PRILOSEC) 40 MG capsule, Take 1 capsule (40 mg total) by mouth daily., Disp: 90 capsule, Rfl: 1   valsartan-hydrochlorothiazide (DIOVAN-HCT) 320-25 MG tablet, Take 1 tablet by mouth daily., Disp: 90 tablet, Rfl: 1  Allergies  Allergen Reactions   Other Swelling    YOGURT   Shellfish Allergy Hives    I personally reviewed active problem list, medication list, allergies, family history, social history, health maintenance with the patient/caregiver today.   ROS  Constitutional: Negative for fever or weight change.  Respiratory: Negative for cough and shortness of breath.   Cardiovascular: Negative for chest pain or palpitations.  Gastrointestinal: Negative for abdominal pain, no bowel changes.  Musculoskeletal: Negative for gait problem or joint swelling.  Skin: Negative for rash.  Neurological: Negative for dizziness or headache.  No other specific complaints in a complete review of systems (except as listed in HPI above).   Objective  Vitals:   05/04/23 1503  BP: 132/80  Pulse: 92  Resp: 16  SpO2: 98%  Weight: 248 lb (112.5 kg)  Height: 6' (1.829 m)    Body mass index is 33.63 kg/m.  Physical Exam  Constitutional: Patient appears well-developed and well-nourished. Obese  No distress.  HEENT: head atraumatic, normocephalic, pupils equal and reactive to light, neck supple Cardiovascular: Normal rate, regular rhythm and normal heart sounds.  No murmur heard. No BLE edema. Pulmonary/Chest: Effort normal and breath sounds normal. No  respiratory distress. Abdominal: Soft.  There is no tenderness. Psychiatric: Patient has a normal mood and affect. behavior is normal. Judgment and thought content normal.   PHQ2/9:    05/04/2023    3:02 PM 01/30/2023    3:03 PM 10/31/2022    2:58 PM 08/29/2022   12:57 PM 07/25/2022    1:50 PM  Depression screen PHQ 2/9  Decreased Interest 2 3 1 1 1   Down, Depressed, Hopeless 1 2 1  0 1  PHQ - 2 Score 3 5 2 1 2   Altered sleeping 3 3 3 3 3   Tired, decreased energy 1 3 3 3  0  Change in appetite 0 1 1 0 0  Feeling bad or failure about yourself  0 2 0 0 0  Trouble concentrating 0 3 1 1 3   Moving  slowly or fidgety/restless 0 0 0 0 0  Suicidal thoughts 0 0 0 0 0  PHQ-9 Score 7 17 10 8 8     phq 9 is positive   Fall Risk:    05/04/2023    3:02 PM 01/30/2023    3:03 PM 10/31/2022    2:57 PM 08/29/2022   12:50 PM 07/25/2022    1:50 PM  Fall Risk   Falls in the past year? 0 0 0 0 0  Number falls in past yr: 0 0 0 0 0  Injury with Fall? 0 0 0 0 0  Risk for fall due to : No Fall Risks No Fall Risks No Fall Risks No Fall Risks No Fall Risks  Follow up Falls prevention discussed Falls prevention discussed Falls prevention discussed Falls prevention discussed Falls prevention discussed      Functional Status Survey: Is the patient deaf or have difficulty hearing?: No Does the patient have difficulty seeing, even when wearing glasses/contacts?: No Does the patient have difficulty concentrating, remembering, or making decisions?: No Does the patient have difficulty walking or climbing stairs?: No Does the patient have difficulty dressing or bathing?: No Does the patient have difficulty doing errands alone such as visiting a doctor's office or shopping?: No    Assessment & Plan  1. Perennial allergic rhinitis  - fluticasone (FLONASE) 50 MCG/ACT nasal spray; Place 2 sprays into both nostrils daily.  Dispense: 48 g; Refill: 0  2. Chronic recurrent major depressive disorder (HCC)  -  escitalopram (LEXAPRO) 10 MG tablet; Take 1 tablet (10 mg total) by mouth daily.  Dispense: 90 tablet; Refill: 0 - buPROPion (WELLBUTRIN XL) 300 MG 24 hr tablet; Take 1 tablet (300 mg total) by mouth daily.  Dispense: 90 tablet; Refill: 0  3. Benign hypertension with chronic kidney disease, stage I  - atenolol (TENORMIN) 25 MG tablet; Take 1 tablet (25 mg total) by mouth daily.  Dispense: 90 tablet; Refill: 0 - valsartan-hydrochlorothiazide (DIOVAN-HCT) 320-25 MG tablet; Take 1 tablet by mouth daily.  Dispense: 90 tablet; Refill: 0  4. Anxiety, generalized  - atenolol (TENORMIN) 25 MG tablet; Take 1 tablet (25 mg total) by mouth daily.  Dispense: 90 tablet; Refill: 0  5. Gastroesophageal reflux disease without esophagitis  Doing well on prn medication  6. Low vitamin D level  Continue supplementation   7. Obesity (BMI 30.0-34.9)  Discussed with the patient the risk posed by an increased BMI. Discussed importance of portion control, calorie counting and at least 150 minutes of physical activity weekly. Avoid sweet beverages and drink more water. Eat at least 6 servings of fruit and vegetables daily    8. Dyslipidemia

## 2023-05-04 ENCOUNTER — Ambulatory Visit (INDEPENDENT_AMBULATORY_CARE_PROVIDER_SITE_OTHER): Payer: PRIVATE HEALTH INSURANCE | Admitting: Family Medicine

## 2023-05-04 VITALS — BP 132/80 | HR 92 | Resp 16 | Ht 72.0 in | Wt 248.0 lb

## 2023-05-04 DIAGNOSIS — F411 Generalized anxiety disorder: Secondary | ICD-10-CM

## 2023-05-04 DIAGNOSIS — I129 Hypertensive chronic kidney disease with stage 1 through stage 4 chronic kidney disease, or unspecified chronic kidney disease: Secondary | ICD-10-CM

## 2023-05-04 DIAGNOSIS — J3089 Other allergic rhinitis: Secondary | ICD-10-CM | POA: Diagnosis not present

## 2023-05-04 DIAGNOSIS — E785 Hyperlipidemia, unspecified: Secondary | ICD-10-CM

## 2023-05-04 DIAGNOSIS — F339 Major depressive disorder, recurrent, unspecified: Secondary | ICD-10-CM | POA: Diagnosis not present

## 2023-05-04 DIAGNOSIS — N181 Chronic kidney disease, stage 1: Secondary | ICD-10-CM

## 2023-05-04 DIAGNOSIS — R7989 Other specified abnormal findings of blood chemistry: Secondary | ICD-10-CM

## 2023-05-04 DIAGNOSIS — K219 Gastro-esophageal reflux disease without esophagitis: Secondary | ICD-10-CM

## 2023-05-04 DIAGNOSIS — E669 Obesity, unspecified: Secondary | ICD-10-CM

## 2023-05-04 MED ORDER — ATENOLOL 25 MG PO TABS: 25.0000 mg | ORAL_TABLET | Freq: Every day | ORAL | 0 refills | Status: DC

## 2023-05-04 MED ORDER — ESCITALOPRAM OXALATE 10 MG PO TABS
10.0000 mg | ORAL_TABLET | Freq: Every day | ORAL | 0 refills | Status: DC
Start: 2023-05-04 — End: 2023-07-30

## 2023-05-04 MED ORDER — VALSARTAN-HYDROCHLOROTHIAZIDE 320-25 MG PO TABS
1.0000 | ORAL_TABLET | Freq: Every day | ORAL | 0 refills | Status: DC
Start: 2023-05-04 — End: 2023-07-30

## 2023-05-04 MED ORDER — BUPROPION HCL ER (XL) 300 MG PO TB24
300.0000 mg | ORAL_TABLET | Freq: Every day | ORAL | 0 refills | Status: DC
Start: 2023-05-04 — End: 2023-07-30

## 2023-05-04 MED ORDER — FLUTICASONE PROPIONATE 50 MCG/ACT NA SUSP: 2.0000 | Freq: Every day | NASAL | 0 refills | Status: DC

## 2023-07-30 ENCOUNTER — Other Ambulatory Visit: Payer: Self-pay

## 2023-07-30 DIAGNOSIS — I129 Hypertensive chronic kidney disease with stage 1 through stage 4 chronic kidney disease, or unspecified chronic kidney disease: Secondary | ICD-10-CM

## 2023-07-30 DIAGNOSIS — F411 Generalized anxiety disorder: Secondary | ICD-10-CM

## 2023-07-30 DIAGNOSIS — F339 Major depressive disorder, recurrent, unspecified: Secondary | ICD-10-CM

## 2023-08-01 MED ORDER — ESCITALOPRAM OXALATE 10 MG PO TABS
10.0000 mg | ORAL_TABLET | Freq: Every day | ORAL | 0 refills | Status: DC
Start: 2023-08-01 — End: 2023-11-01

## 2023-08-01 MED ORDER — ATENOLOL 25 MG PO TABS
25.0000 mg | ORAL_TABLET | Freq: Every day | ORAL | 0 refills | Status: DC
Start: 2023-08-01 — End: 2023-11-01

## 2023-08-01 MED ORDER — BUPROPION HCL ER (XL) 300 MG PO TB24
300.0000 mg | ORAL_TABLET | Freq: Every day | ORAL | 0 refills | Status: DC
Start: 2023-08-01 — End: 2023-11-01

## 2023-08-01 MED ORDER — VALSARTAN-HYDROCHLOROTHIAZIDE 320-25 MG PO TABS: 1.0000 | ORAL_TABLET | Freq: Every day | ORAL | 0 refills | Status: DC

## 2023-08-01 NOTE — Telephone Encounter (Signed)
Pt siad is very hard for him to get off due to the holidays but will camm [place] if he can not come in on the 18th.

## 2023-08-06 ENCOUNTER — Ambulatory Visit: Payer: PRIVATE HEALTH INSURANCE | Admitting: Family Medicine

## 2023-08-10 ENCOUNTER — Ambulatory Visit: Payer: PRIVATE HEALTH INSURANCE | Admitting: Family Medicine

## 2023-10-27 ENCOUNTER — Other Ambulatory Visit: Payer: Self-pay | Admitting: Family Medicine

## 2023-10-27 DIAGNOSIS — F411 Generalized anxiety disorder: Secondary | ICD-10-CM

## 2023-10-27 DIAGNOSIS — F339 Major depressive disorder, recurrent, unspecified: Secondary | ICD-10-CM

## 2023-10-29 NOTE — Telephone Encounter (Signed)
 Requested Prescriptions  Pending Prescriptions Disp Refills   busPIRone  (BUSPAR ) 5 MG tablet [Pharmacy Med Name: BUSPIRONE  5 MG TAB[*]] 30 tablet 0    Sig: TAKE ONE TABLET BY MOUTH ONE TIME DAILY AS NEEDED     Psychiatry: Anxiolytics/Hypnotics - Non-controlled Passed - 10/29/2023  1:53 PM      Passed - Valid encounter within last 12 months    Recent Outpatient Visits           5 months ago Chronic recurrent major depressive disorder [facility] [place])   [place] [facility] Arleen Lacer, MD   9 months ago Chronic recurrent major depressive disorder [facility])   [place] [facility] Arleen Lacer, MD   12 months ago Chronic recurrent major depressive disorder [facility])   [place] [facility] Sowles, Krichna, MD   1 year ago Gastroenteritis   [facility] Rockney Cid, DO   1 year ago Chronic recurrent major depressive disorder [facility])   [facility] Arleen Lacer, MD       Future Appointments             In 3 days Sowles, Krichna, MD [facility], [facility]

## 2023-11-01 ENCOUNTER — Ambulatory Visit (INDEPENDENT_AMBULATORY_CARE_PROVIDER_SITE_OTHER): Payer: PRIVATE HEALTH INSURANCE | Admitting: Family Medicine

## 2023-11-01 VITALS — BP 138/84 | HR 84 | Resp 16 | Ht 72.0 in | Wt 249.2 lb

## 2023-11-01 DIAGNOSIS — E66811 Obesity, class 1: Secondary | ICD-10-CM | POA: Diagnosis not present

## 2023-11-01 DIAGNOSIS — F411 Generalized anxiety disorder: Secondary | ICD-10-CM | POA: Diagnosis not present

## 2023-11-01 DIAGNOSIS — I129 Hypertensive chronic kidney disease with stage 1 through stage 4 chronic kidney disease, or unspecified chronic kidney disease: Secondary | ICD-10-CM

## 2023-11-01 DIAGNOSIS — J3089 Other allergic rhinitis: Secondary | ICD-10-CM

## 2023-11-01 DIAGNOSIS — R7989 Other specified abnormal findings of blood chemistry: Secondary | ICD-10-CM

## 2023-11-01 DIAGNOSIS — F339 Major depressive disorder, recurrent, unspecified: Secondary | ICD-10-CM | POA: Diagnosis not present

## 2023-11-01 DIAGNOSIS — Z131 Encounter for screening for diabetes mellitus: Secondary | ICD-10-CM

## 2023-11-01 DIAGNOSIS — K219 Gastro-esophageal reflux disease without esophagitis: Secondary | ICD-10-CM

## 2023-11-01 DIAGNOSIS — F5105 Insomnia due to other mental disorder: Secondary | ICD-10-CM

## 2023-11-01 DIAGNOSIS — F99 Mental disorder, not otherwise specified: Secondary | ICD-10-CM

## 2023-11-01 DIAGNOSIS — E785 Hyperlipidemia, unspecified: Secondary | ICD-10-CM

## 2023-11-01 DIAGNOSIS — N181 Chronic kidney disease, stage 1: Secondary | ICD-10-CM

## 2023-11-01 MED ORDER — TRAZODONE HCL 50 MG PO TABS: 25.0000 mg | ORAL_TABLET | Freq: Every evening | ORAL | 1 refills | Status: DC | PRN

## 2023-11-01 MED ORDER — BUSPIRONE HCL 5 MG PO TABS: 5.0000 mg | ORAL_TABLET | Freq: Every day | ORAL | 0 refills | Status: DC | PRN

## 2023-11-01 MED ORDER — ESCITALOPRAM OXALATE 10 MG PO TABS: 10.0000 mg | ORAL_TABLET | Freq: Every day | ORAL | 1 refills | Status: DC

## 2023-11-01 MED ORDER — ATENOLOL 25 MG PO TABS: 25.0000 mg | ORAL_TABLET | Freq: Every day | ORAL | 1 refills | Status: DC

## 2023-11-01 MED ORDER — VALSARTAN-HYDROCHLOROTHIAZIDE 320-25 MG PO TABS: 1.0000 | ORAL_TABLET | Freq: Every day | ORAL | 1 refills | Status: DC

## 2023-11-01 MED ORDER — BUPROPION HCL ER (XL) 300 MG PO TB24: 300.0000 mg | ORAL_TABLET | Freq: Every day | ORAL | 1 refills | Status: DC

## 2023-11-01 NOTE — Progress Notes (Signed)
Name: Mason Parker   MRN: [id number]    DOB: Jul 07, 1991   Date:11/01/2023       Progress Note  Subjective  Chief Complaint  Chief Complaint  Patient presents with   Medical Management of Chronic Issues   HPI  Obesity: weight is slightly lower than last time, cooking at home, discussed importance of regular physical activity    Major Depression -chronic recurrent  and GAD: Currently on Lexapro 10 mg and Wellbutrin 300 mg, he was off medication for a couple of months, he noticed anhedonia without taking the medication, he just resumed it a few days ago and has noticed improvement in motivation. He is going to change positions at work , looking forward to raise and a different environment He has a history of ADD, long history of inability to fall and stay asleep, he is finally ready to try medications Phq 9 is still postiive, he has a history of depression since teenage years    HTN: he is back on valsartan hctz  320/25 mg and Atenolol  for the past few days and bp today is okay. He has changed his diet, less salt, he avoids processed food. Denies chest pain , palpitation or sob. He has CKI stage I, he has a history of microalbuminuria but last urine micro normal and also normal GFR .   GERD:he is currently taking Omeprazole prn only and symptoms are controlled    Vitamin D deficiency: reminded him to take vitamin D otc    Dyslipidemia: last LDL was trending up,he has a history of elevated triglycerides we will recheck labs    AR: he has been using nasal spray and anti-histamines and unchanged    Patient Active Problem List   Diagnosis Date Noted   Moderate episode of recurrent major depressive disorder (HCC) 08/10/2020   Tinnitus of both ears 08/10/2020   Benign hypertension with chronic kidney disease, stage I 08/10/2020   Low vitamin D level 08/10/2020   Recurrent dislocation of right shoulder 07/16/2015   Verruca warts (infectious) 06/03/2015   Anxiety, generalized 02/25/2015    Insomnia 02/25/2015   Gastro-esophageal reflux disease without esophagitis 02/25/2015   Chronic recurrent major depressive disorder (HCC) 02/25/2015   Obesity (BMI 30.0-34.9) 02/25/2015   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   Perennial allergic rhinitis with seasonal variation 02/25/2015   Learning disability 02/25/2015    Past Surgical History:  Procedure Laterality Date   CIRCUMCISION  1991   SHOULDER ARTHROSCOPY Right 09/09/2015   Procedure: ARTHROSCOPY SHOULDER, RIGHT SHOULDER ATHROSCOPIC ANTERIOR AND POSTERIOR LABRAL TEAR REPAIR, CAPSULORRHAPHY;  Surgeon: Juanell Fairly, MD;  Location: [facility] ORS;  Service: Orthopedics;  Laterality: Right;    Family History  Problem Relation Age of Onset   Bipolar disorder Sister    Depression Brother    Hypertension Mother    Heart disease Mother    Depression Mother    Hypertension Father    Bipolar disorder Father    Hyperlipidemia Father    Obesity Father     Social History   Tobacco Use   Smoking status: Never   Smokeless tobacco: Never  Substance Use Topics   Alcohol use: No    Alcohol/week: 0.0 standard drinks of alcohol     Current Outpatient Medications:    atenolol (TENORMIN) 25 MG tablet, Take 1 tablet (25 mg total) by mouth daily., Disp: 30 tablet, Rfl: 0   azelastine (ASTELIN) 0.1 % nasal spray, Place 2 sprays into both nostrils 2 (two) times daily. Use in each  nostril as directed, Disp: 30 mL, Rfl: 2   buPROPion (WELLBUTRIN XL) 300 MG 24 hr tablet, Take 1 tablet (300 mg total) by mouth daily., Disp: 30 tablet, Rfl: 0   busPIRone (BUSPAR) 5 MG tablet, TAKE ONE TABLET BY MOUTH ONE TIME DAILY AS NEEDED, Disp: 30 tablet, Rfl: 0   Cholecalciferol (VITAMIN D) 50 MCG (2000 UT) CAPS, Take 1 capsule by mouth daily., Disp: , Rfl:    escitalopram (LEXAPRO) 10 MG tablet, Take 1 tablet (10 mg total) by mouth daily., Disp: 30 tablet, Rfl: 0   fluticasone (FLONASE) 50 MCG/ACT nasal spray, Place 2 sprays into both nostrils daily., Disp: 48  g, Rfl: 0   levocetirizine (XYZAL) 5 MG tablet, TAKE ONE TABLET BY MOUTH EVERY EVENING, Disp: 90 tablet, Rfl: 1   Melatonin 5 MG CAPS, Take by mouth., Disp: , Rfl:    omeprazole (PRILOSEC) 40 MG capsule, Take 1 capsule (40 mg total) by mouth daily., Disp: 90 capsule, Rfl: 1   valsartan-hydrochlorothiazide (DIOVAN-HCT) 320-25 MG tablet, Take 1 tablet by mouth daily., Disp: 30 tablet, Rfl: 0  Allergies  Allergen Reactions   Other Swelling    YOGURT   Shellfish Allergy Hives    I personally reviewed active problem list, medication list, allergies, family history with the patient/caregiver today.   ROS  Ten systems reviewed and is negative except as mentioned in HPI    Objective  Vitals:   11/01/23 1508  BP: 138/84  Pulse: 84  Resp: 16  SpO2: 97%  Weight: 249 lb 3.2 oz (113 kg)  Height: 6' (1.829 m)    Body mass index is 33.8 kg/m.  Physical Exam  Constitutional: Patient appears well-developed and well-nourished. Obese  No distress.  HEENT: head atraumatic, normocephalic, pupils equal and reactive to light, neck supple Cardiovascular: Normal rate, regular rhythm and normal heart sounds.  No murmur heard. No BLE edema. Pulmonary/Chest: Effort normal and breath sounds normal. No respiratory distress. Abdominal: Soft.  There is no tenderness. Psychiatric: Patient has a normal mood and affect. behavior is normal. Judgment and thought content normal.   Diabetic Foot Exam:     PHQ2/9:    11/01/2023    3:05 PM 05/04/2023    3:02 PM 01/30/2023    3:03 PM 10/31/2022    2:58 PM 08/29/2022   12:57 PM  Depression screen PHQ 2/9  Decreased Interest 3 2 3 1 1   Down, Depressed, Hopeless 3 1 2 1  0  PHQ - 2 Score 6 3 5 2 1   Altered sleeping 3 3 3 3 3   Tired, decreased energy 3 1 3 3 3   Change in appetite 0 0 1 1 0  Feeling bad or failure about yourself  0 0 2 0 0  Trouble concentrating 3 0 3 1 1   Moving slowly or fidgety/restless 0 0 0 0 0  Suicidal thoughts 0 0 0 0 0  PHQ-9  Score 15 7 17 10 8   Difficult doing work/chores Very difficult        phq 9 is positive  Fall Risk:    11/01/2023    3:03 PM 05/04/2023    3:02 PM 01/30/2023    3:03 PM 10/31/2022    2:57 PM 08/29/2022   12:50 PM  Fall Risk   Falls in the past year? 0 0 0 0 0  Number falls in past yr: 0 0 0 0 0  Injury with Fall? 0 0 0 0 0  Risk for fall due to :  No Fall Risks No Fall Risks No Fall Risks No Fall Risks No Fall Risks  Follow up Falls prevention discussed;Education provided;Falls evaluation completed Falls prevention discussed Falls prevention discussed Falls prevention discussed Falls prevention discussed     Assessment & Plan   1. Benign hypertension with chronic kidney disease, stage I (Primary)  - atenolol (TENORMIN) 25 MG tablet; Take 1 tablet (25 mg total) by mouth daily.  Dispense: 90 tablet; Refill: 1 - valsartan-hydrochlorothiazide (DIOVAN-HCT) 320-25 MG tablet; Take 1 tablet by mouth daily.  Dispense: 90 tablet; Refill: 1 - CBC with Differential/Platelet - COMPLETE METABOLIC PANEL WITH GFR - Microalbumin / creatinine urine ratio  2. Anxiety, generalized  - atenolol (TENORMIN) 25 MG tablet; Take 1 tablet (25 mg total) by mouth daily.  Dispense: 90 tablet; Refill: 1 - busPIRone (BUSPAR) 5 MG tablet; Take 1 tablet (5 mg total) by mouth daily as needed.  Dispense: 90 tablet; Refill: 0  3. Chronic recurrent major depressive disorder (HCC)  - buPROPion (WELLBUTRIN XL) 300 MG 24 hr tablet; Take 1 tablet (300 mg total) by mouth daily.  Dispense: 90 tablet; Refill: 1 - escitalopram (LEXAPRO) 10 MG tablet; Take 1 tablet (10 mg total) by mouth daily.  Dispense: 90 tablet; Refill: 1 - busPIRone (BUSPAR) 5 MG tablet; Take 1 tablet (5 mg total) by mouth daily as needed.  Dispense: 90 tablet; Refill: 0  4. Obesity (BMI 30.0-34.9)  Discussed with the patient the risk posed by an increased BMI. Discussed importance of portion control, calorie counting and at least 150 minutes of  physical activity weekly. Avoid sweet beverages and drink more water. Eat at least 6 servings of fruit and vegetables daily    5. Low vitamin D level  Resume vitamin D supplementation   6. Gastroesophageal reflux disease without esophagitis  Controlled   7. Perennial allergic rhinitis  Continue medication  8. Dyslipidemia  - Lipid panel  9. Diabetes mellitus screening  - Hemoglobin A1c  10. Insomnia due to other mental disorder  - traZODone (DESYREL) 50 MG tablet; Take 0.5-1 tablets (25-50 mg total) by mouth at bedtime as needed for sleep.  Dispense: 90 tablet; Refill: 1

## 2023-12-11 ENCOUNTER — Telehealth: Payer: PRIVATE HEALTH INSURANCE | Admitting: Physician Assistant

## 2023-12-11 DIAGNOSIS — B9689 Other specified bacterial agents as the cause of diseases classified elsewhere: Secondary | ICD-10-CM

## 2023-12-11 DIAGNOSIS — J019 Acute sinusitis, unspecified: Secondary | ICD-10-CM

## 2023-12-11 MED ORDER — AMOXICILLIN-POT CLAVULANATE 875-125 MG PO TABS
1.0000 | ORAL_TABLET | Freq: Two times a day (BID) | ORAL | 0 refills | Status: DC
Start: 2023-12-11 — End: 2024-04-16

## 2023-12-11 MED ORDER — PREDNISONE 10 MG (21) PO TBPK: ORAL_TABLET | ORAL | 0 refills | Status: DC

## 2023-12-11 NOTE — Progress Notes (Signed)
 I have spent 5 minutes in review of e-visit questionnaire, review and updating patient chart, medical decision making and response to patient.   Piedad Climes, PA-C

## 2023-12-11 NOTE — Progress Notes (Signed)
 E-Visit for Sinus Problems  We are sorry that you are not feeling well.  Here is how we plan to help!  Based on what you have shared with me it looks like you have sinusitis.  Sinusitis is inflammation and infection in the sinus cavities of the head.  Based on your presentation I believe you most likely have Acute Bacterial Sinusitis.  This is an infection caused by bacteria and is treated with antibiotics. I have prescribed Augmentin 875mg /125mg  one tablet twice daily with food, for 7 days. I have also added on a short course of an oral steroid to take as directed.    You may use an oral decongestant such as Mucinex D or if you have glaucoma or high blood pressure use plain Mucinex. Saline nasal spray help and can safely be used as often as needed for congestion.  If you develop worsening sinus pain, fever or notice severe headache and vision changes, or if symptoms are not better after completion of antibiotic, please schedule an appointment with a health care provider.    Sinus infections are not as easily transmitted as other respiratory infection, however we still recommend that you avoid close contact with loved ones, especially the very young and elderly.  Remember to wash your hands thoroughly throughout the day as this is the number one way to prevent the spread of infection!  I have sent a work note to Pharmacologist. You can find by going to the Menu on your homepage, scrolling down to the Communications section, and selecting Letters. Let us know if you have any issue locating. Take care and feel better soon!   Home Care: Only take medications as instructed by your medical team. Complete the entire course of an antibiotic. Do not take these medications with alcohol. A steam or ultrasonic humidifier can help congestion.  You can place a towel over your head and breathe in the steam from hot water coming from a faucet. Avoid close contacts especially the very young and the elderly. Cover  your mouth when you cough or sneeze. Always remember to wash your hands.  Get Help Right Away If: You develop worsening fever or sinus pain. You develop a severe head ache or visual changes. Your symptoms persist after you have completed your treatment plan.  Make sure you Understand these instructions. Will watch your condition. Will get help right away if you are not doing well or get worse.  Thank you for choosing an e-visit.  Your e-visit answers were reviewed by a board certified advanced clinical practitioner to complete your personal care plan. Depending upon the condition, your plan could have included both over the counter or prescription medications.  Please review your pharmacy choice. Make sure the pharmacy is open so you can pick up prescription now. If there is a problem, you may contact your provider through [employer] and have the prescription routed to another pharmacy.  Your safety is important to [place]. If you have drug allergies check your prescription carefully.   For the next 24 hours you can use MyChart to ask questions about today's visit, request a non-urgent call back, or ask for a work or school excuse. You will get an email in the next two days asking about your experience. I hope that your e-visit has been valuable and will speed your recovery.

## 2024-04-16 ENCOUNTER — Ambulatory Visit (INDEPENDENT_AMBULATORY_CARE_PROVIDER_SITE_OTHER): Payer: PRIVATE HEALTH INSURANCE | Admitting: Family Medicine

## 2024-04-16 VITALS — BP 124/78 | HR 82 | Resp 16 | Ht 72.0 in | Wt 246.1 lb

## 2024-04-16 DIAGNOSIS — I10 Essential (primary) hypertension: Secondary | ICD-10-CM | POA: Diagnosis not present

## 2024-04-16 DIAGNOSIS — F339 Major depressive disorder, recurrent, unspecified: Secondary | ICD-10-CM

## 2024-04-16 DIAGNOSIS — L219 Seborrheic dermatitis, unspecified: Secondary | ICD-10-CM

## 2024-04-16 DIAGNOSIS — E785 Hyperlipidemia, unspecified: Secondary | ICD-10-CM

## 2024-04-16 DIAGNOSIS — F5105 Insomnia due to other mental disorder: Secondary | ICD-10-CM | POA: Diagnosis not present

## 2024-04-16 DIAGNOSIS — R7989 Other specified abnormal findings of blood chemistry: Secondary | ICD-10-CM

## 2024-04-16 DIAGNOSIS — F99 Mental disorder, not otherwise specified: Secondary | ICD-10-CM

## 2024-04-16 DIAGNOSIS — K219 Gastro-esophageal reflux disease without esophagitis: Secondary | ICD-10-CM | POA: Diagnosis not present

## 2024-04-16 MED ORDER — BUPROPION HCL ER (XL) 300 MG PO TB24: 300.0000 mg | ORAL_TABLET | Freq: Every day | ORAL | 1 refills | Status: AC

## 2024-04-16 MED ORDER — KETOCONAZOLE 2 % EX SHAM: 1.0000 | MEDICATED_SHAMPOO | CUTANEOUS | 2 refills | Status: AC

## 2024-04-16 MED ORDER — ESCITALOPRAM OXALATE 10 MG PO TABS: 10.0000 mg | ORAL_TABLET | Freq: Every day | ORAL | 1 refills | Status: AC

## 2024-04-16 MED ORDER — ATENOLOL 25 MG PO TABS: 25.0000 mg | ORAL_TABLET | Freq: Every day | ORAL | 1 refills | Status: AC

## 2024-04-16 MED ORDER — TRAZODONE HCL 50 MG PO TABS: 25.0000 mg | ORAL_TABLET | Freq: Every evening | ORAL | 1 refills | Status: AC | PRN

## 2024-04-16 MED ORDER — BUSPIRONE HCL 5 MG PO TABS: 5.0000 mg | ORAL_TABLET | Freq: Every day | ORAL | 0 refills | Status: AC | PRN

## 2024-04-16 MED ORDER — VALSARTAN-HYDROCHLOROTHIAZIDE 320-25 MG PO TABS: 1.0000 | ORAL_TABLET | Freq: Every day | ORAL | 1 refills | Status: AC

## 2024-04-16 MED ORDER — OMEPRAZOLE 40 MG PO CPDR: 40.0000 mg | DELAYED_RELEASE_CAPSULE | Freq: Every day | ORAL | 1 refills | Status: AC

## 2024-04-16 MED ORDER — KETOCONAZOLE 2 % EX CREA: 1.0000 | TOPICAL_CREAM | Freq: Two times a day (BID) | CUTANEOUS | 1 refills | Status: AC | PRN

## 2024-04-16 NOTE — Progress Notes (Signed)
 Name: Mason Parker   MRN: [id number]    DOB: 01-12-1991   Date:04/16/2024       Progress Note  Subjective  Chief Complaint  Chief Complaint  Patient presents with   Medical Management of Chronic Issues    Pt feeling down due to best friend passed away   Discussed the use of AI scribe software for clinical note transcription with the patient, who gave verbal consent to proceed.  History of Present Illness Mason Parker is a 33 year old male with hypertension who presents for a follow-up visit.  His blood pressure is well-controlled with atenolol  25 mg and valsartan  HCTZ. No dizziness, chest pain, or palpitations are reported.  He experiences scaly skin issues and patchiness, which resolved with prednisone  and amoxicillin  for a sinus infection in March. The skin issues are not significant but noticeable. He has a history of seborrheic dermatitis, particularly under his beard and on his face, which his mother has mentioned.  He manages dyslipidemia through diet and has not been on cholesterol medication. His last lab work in 2023 showed elevated triglycerides and low HDL levels.  His gastroesophageal reflux disease (GERD) is generally well-controlled, flaring with spicy or acidic foods. He takes omeprazole  as needed.  He is experiencing chronic recurrent major depression, worsened by the recent death of a close friend. He feels reminders of his friend frequently, contributing to his depression. A recent improvement in his home environment due to his brother's absence has reduced stress levels. He is currently dating and feels more relaxed at home, which has improved his functionality and organization. He continues to take trazodone  for insomnia, which he finds effective.    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   Perennial allergic rhinitis with seasonal variation 02/25/2015   Learning disability 02/25/2015    Past Surgical History:  Procedure Laterality Date   CIRCUMCISION  1991   SHOULDER ARTHROSCOPY Right 09/09/2015   Procedure: ARTHROSCOPY SHOULDER, RIGHT SHOULDER ATHROSCOPIC ANTERIOR AND POSTERIOR LABRAL TEAR REPAIR, CAPSULORRHAPHY;  Surgeon: Franky Cranker, MD;  Location: [facility];  Service: Orthopedics;  Laterality: Right;    Family History  Problem Relation Age of Onset   Bipolar disorder Sister    Depression Brother    Hypertension Mother    Heart disease Mother    Depression Mother    Hypertension Father    Bipolar disorder Father    Hyperlipidemia Father    Obesity Father     Social History   Tobacco Use   Smoking status: Never   Smokeless tobacco: Never  Substance Use Topics   Alcohol use: No    Alcohol/week: 0.0 standard drinks of alcohol     Current Outpatient Medications:    atenolol  (TENORMIN ) 25 MG tablet, Take 1 tablet (25 mg total) by mouth daily., Disp: 90 tablet, Rfl: 1   azelastine  (ASTELIN ) 0.1 % nasal spray, Place 2 sprays into both nostrils 2 (two) times daily. Use in each nostril as directed, Disp: 30  mL, Rfl: 2   buPROPion  (WELLBUTRIN  XL) 300 MG 24 hr tablet, Take 1 tablet (300 mg total) by mouth daily., Disp: 90 tablet, Rfl: 1   busPIRone  (BUSPAR ) 5 MG tablet, Take 1 tablet (5 mg total) by mouth daily as needed., Disp: 90 tablet, Rfl: 0   Cholecalciferol (VITAMIN D ) 50 MCG (2000 UT) CAPS, Take 1 capsule by mouth daily., Disp: , Rfl:    escitalopram   (LEXAPRO ) 10 MG tablet, Take 1 tablet (10 mg total) by mouth daily., Disp: 90 tablet, Rfl: 1   fluticasone  (FLONASE ) 50 MCG/ACT nasal spray, Place 2 sprays into both nostrils daily., Disp: 48 g, Rfl: 0   levocetirizine (XYZAL ) 5 MG tablet, TAKE ONE TABLET BY MOUTH EVERY EVENING, Disp: 90 tablet, Rfl: 1   Melatonin 5 MG CAPS, Take by mouth., Disp: , Rfl:    omeprazole  (PRILOSEC) 40 MG capsule, Take 1 capsule (40 mg total) by mouth daily., Disp: 90 capsule, Rfl: 1   predniSONE  (STERAPRED UNI-PAK 21 TAB) 10 MG (21) TBPK tablet, Take following package directions, Disp: 21 tablet, Rfl: 0   traZODone  (DESYREL ) 50 MG tablet, Take 0.5-1 tablets (25-50 mg total) by mouth at bedtime as needed for sleep., Disp: 90 tablet, Rfl: 1   valsartan -hydrochlorothiazide  (DIOVAN -HCT) 320-25 MG tablet, Take 1 tablet by mouth daily., Disp: 90 tablet, Rfl: 1   amoxicillin -clavulanate (AUGMENTIN ) 875-125 MG tablet, Take 1 tablet by mouth 2 (two) times daily., Disp: 14 tablet, Rfl: 0  Allergies  Allergen Reactions   Other Swelling    YOGURT   Shellfish Allergy Hives    I personally reviewed active problem list, medication list, allergies, family history with the patient/caregiver today.   ROS  Ten systems reviewed and is negative except as mentioned in HPI    Objective Physical Exam  CONSTITUTIONAL: Patient appears well-developed and well-nourished.  No distress. HEENT: Head atraumatic, normocephalic, neck supple. CARDIOVASCULAR: Normal rate, regular rhythm and normal heart sounds.  No murmur heard. No BLE edema. PULMONARY: Effort normal and breath sounds normal. No respiratory distress. ABDOMINAL: There is no tenderness or distention. MUSCULOSKELETAL: Normal gait. Without gross motor or sensory deficit. PSYCHIATRIC: Patient has a normal mood and affect. behavior is normal. Judgment and thought content normal.  Vitals:   04/16/24 1508  BP: 124/78  Pulse: 82  Resp: 16  SpO2: 99%  Weight: 246 lb 1.6 oz  (111.6 kg)  Height: 6' (1.829 m)    Body mass index is 33.38 kg/m.    PHQ2/9:    04/16/2024    3:07 PM 11/01/2023    3:05 PM 05/04/2023    3:02 PM 01/30/2023    3:03 PM 10/31/2022    2:58 PM  Depression screen PHQ 2/9  Decreased Interest 3 3 2 3 1   Down, Depressed, Hopeless 3 3 1 2 1   PHQ - 2 Score 6 6 3 5 2   Altered sleeping 3 3 3 3 3   Tired, decreased energy 3 3 1 3 3   Change in appetite 3 0 0 1 1  Feeling bad or failure about yourself  0 0 0 2 0  Trouble concentrating 3 3 0 3 1  Moving slowly or fidgety/restless 0 0 0 0 0  Suicidal thoughts 0 0 0 0 0  PHQ-9 Score 18 15 7 17 10   Difficult doing work/chores Very difficult Very difficult       phq 9 is positive  Fall Risk:    04/16/2024    3:03 PM 11/01/2023    3:03  PM 05/04/2023    3:02 PM 01/30/2023    3:03 PM 10/31/2022    2:57 PM  Fall Risk   Falls in the past year? 0 0 0 0 0  Number falls in past yr: 0 0 0 0 0  Injury with Fall? 0 0 0 0 0  Risk for fall due to : No Fall Risks No Fall Risks No Fall Risks No Fall Risks No Fall Risks  Follow up Falls evaluation completed Falls prevention discussed;Education provided;Falls evaluation completed Falls prevention discussed Falls prevention discussed Falls prevention discussed      Assessment & Plan Hypertension Hypertension controlled with atenolol  and valsartan  HCTZ. Blood pressure 124/78 mmHg. No side effects reported. - Continue atenolol  25 mg daily. - Continue valsartan  HCTZ daily.  Dyslipidemia Dyslipidemia managed with diet. Last lipid panel showed elevated triglycerides and low HDL. - Order lipid panel to assess current cholesterol levels.  Seborrheic dermatitis of face and beard Seborrheic dermatitis improved with previous treatment. Ketoconazole  shampoo recommended. Caution with topical steroids near eyes due to cataract risk. - Prescribe ketoconazole  2% shampoo, use twice a week on scalp, face, and beard. - Consider topical steroid if antifungal treatment  is insufficient, avoid near eyes due to cataract risk. - Advise keeping face clean and avoiding oily lotions.  Gastroesophageal reflux disease (GERD) GERD managed with dietary modifications and omeprazole  as needed. Symptoms triggered by spicy and acidic foods. - Continue omeprazole  as needed for symptom control.  Major depressive disorder and anxiety disorder Depression and anxiety exacerbated by bereavement. Atenolol  aids anxiety and blood pressure control. Bereavement impacts daily life. - Continue current management with Wellbutrin  for depression. - Monitor mood and adjust treatment as necessary.  Insomnia Insomnia managed with trazodone  taken at night. - Continue trazodone  for insomnia.

## 2024-04-17 ENCOUNTER — Ambulatory Visit: Payer: Self-pay | Admitting: Family Medicine

## 2024-04-17 LAB — COMPREHENSIVE METABOLIC PANEL WITH GFR
AG Ratio: 2 (calc) (ref 1.0–2.5)
ALT: 27 U/L (ref 9–46)
AST: 15 U/L (ref 10–40)
Albumin: 4.9 g/dL (ref 3.6–5.1)
Alkaline phosphatase (APISO): 82 U/L (ref 36–130)
BUN/Creatinine Ratio: 18 (calc) (ref 6–22)
BUN: 24 mg/dL (ref 7–25)
CO2: 29 mmol/L (ref 20–32)
Calcium: 10 mg/dL (ref 8.6–10.3)
Chloride: 103 mmol/L (ref 98–110)
Creat: 1.33 mg/dL — ABNORMAL HIGH (ref 0.60–1.26)
Globulin: 2.5 g/dL (ref 1.9–3.7)
Glucose, Bld: 83 mg/dL (ref 65–99)
Potassium: 4.1 mmol/L (ref 3.5–5.3)
Sodium: 142 mmol/L (ref 135–146)
Total Bilirubin: 0.7 mg/dL (ref 0.2–1.2)
Total Protein: 7.4 g/dL (ref 6.1–8.1)
eGFR: 72 mL/min/1.73m2 (ref >=60)

## 2024-04-17 LAB — LIPID PANEL
Cholesterol: 137 mg/dL (ref <200)
HDL: 46 mg/dL (ref >=40)
LDL Cholesterol (Calc): 66 mg/dL
Non-HDL Cholesterol (Calc): 91 mg/dL (ref <130)
Total CHOL/HDL Ratio: 3 (calc) (ref <5.0)
Triglycerides: 171 mg/dL — ABNORMAL HIGH (ref <150)

## 2024-04-17 LAB — CBC WITH DIFFERENTIAL/PLATELET
Absolute Lymphocytes: 3365 {cells}/uL (ref 850–3900)
Absolute Monocytes: 1250 {cells}/uL — ABNORMAL HIGH (ref 200–950)
Basophils Absolute: 67 {cells}/uL (ref 0–200)
Basophils Relative: 0.5 %
Eosinophils Absolute: 120 {cells}/uL (ref 15–500)
Eosinophils Relative: 0.9 %
HCT: 46.8 % (ref 38.5–50.0)
Hemoglobin: 15.7 g/dL (ref 13.2–17.1)
MCH: 29.7 pg (ref 27.0–33.0)
MCHC: 33.5 g/dL (ref 32.0–36.0)
MCV: 88.5 fL (ref 80.0–100.0)
MPV: 9 fL (ref 7.5–12.5)
Monocytes Relative: 9.4 %
Neutro Abs: 8499 {cells}/uL — ABNORMAL HIGH (ref 1500–7800)
Neutrophils Relative %: 63.9 %
Platelets: 419 Thousand/uL — ABNORMAL HIGH (ref 140–400)
RBC: 5.29 Million/uL (ref 4.20–5.80)
RDW: 12.2 % (ref 11.0–15.0)
Total Lymphocyte: 25.3 %
WBC: 13.3 Thousand/uL — ABNORMAL HIGH (ref 3.8–10.8)

## 2024-04-17 LAB — HEMOGLOBIN A1C
Hgb A1c MFr Bld: 5.2 % (ref <5.7)
Mean Plasma Glucose: 103 mg/dL
eAG (mmol/L): 5.7 mmol/L

## 2024-04-17 LAB — MICROALBUMIN / CREATININE URINE RATIO
Creatinine, Urine: 180 mg/dL (ref 20–320)
Microalb Creat Ratio: 5 mg/g{creat} (ref <30)
Microalb, Ur: 0.9 mg/dL

## 2024-04-18 ENCOUNTER — Other Ambulatory Visit: Payer: Self-pay | Admitting: Family Medicine

## 2024-04-18 DIAGNOSIS — D72829 Elevated white blood cell count, unspecified: Secondary | ICD-10-CM

## 2024-08-18 ENCOUNTER — Ambulatory Visit: Payer: Self-pay

## 2024-08-18 NOTE — Telephone Encounter (Signed)
 FYI Only or Action Required?: FYI only for provider: appointment scheduled on 08/20/24.  Patient was last seen in primary care on 04/16/2024 by Glenard Mire, MD.  Called Nurse Triage reporting Anxiety.  Symptoms began several weeks ago.  Interventions attempted: Prescription medications: Buspar .  Symptoms are: gradually worsening.  Triage Disposition: See PCP When Office is Open (Within 3 Days)  Patient/caregiver understands and will follow disposition?: Yes Reason for Disposition  MODERATE anxiety (e.g., persistent or frequent anxiety symptoms; interferes with sleep, school, or work)  Answer Assessment - Initial Assessment Questions 1. CONCERN: Did anything happen that prompted you to call today?      Family problems that have been a lot. Patient's sister is dating a pedophile.   2. ANXIETY SYMPTOMS: Can you describe how you (your loved one; patient) have been feeling? (e.g., tense, restless, panicky, anxious, keyed up, overwhelmed, sense of impending doom).      Anxious  3. ONSET: How long have you been feeling this way? (e.g., hours, days, weeks)     Last couple of weeks  4. SEVERITY: How would you rate the level of anxiety? (e.g., 0 - 10; or mild, moderate, severe).     Moderate to severe  5. FUNCTIONAL IMPAIRMENT: How have these feelings affected your ability to do daily activities? Have you had more difficulty than usual doing your normal daily activities? (e.g., getting better, same, worse; self-care, school, work, interactions)     Yes  6. HISTORY: Have you felt this way before? Have you ever been diagnosed with an anxiety problem in the past? (e.g., generalized anxiety disorder, panic attacks, PTSD). If Yes, ask: How was this problem treated? (e.g., medicines, counseling, etc.)     Yes  8. TREATMENT:  What has been done so far to treat this anxiety? (e.g., medicines, relaxation strategies). What has helped?     Wellbutrin  and Buspar , been out of  wellbutrin  for quite some time  9. THERAPIST: Do you have a counselor or therapist? If Yes, ask: What is their name?     Denies, trying to get that arranged  11. PATIENT SUPPORT: Who is with you now? Who do you live with? Do you have family or friends who you can talk to?        Yes, family, but the they are going through it as well.  12. OTHER SYMPTOMS: Do you have any other symptoms? (e.g., feeling depressed, trouble concentrating, trouble sleeping, trouble breathing, palpitations or fast heartbeat, chest pain, sweating, nausea, or diarrhea)       Denies  Protocols used: Anxiety and Panic Attack-A-AH  Copied from CRM #[id number]. Topic: Clinical - Red Word Triage >> Aug 18, 2024  3:11 PM Darshell M wrote: Red Word that prompted transfer to Nurse Triage: patient reporting severe anxiety. Significant familial stress causing increased anxiety.

## 2024-08-20 ENCOUNTER — Ambulatory Visit: Payer: PRIVATE HEALTH INSURANCE | Admitting: Family Medicine

## 2024-08-20 VITALS — BP 160/102 | HR 91 | Resp 16 | Ht 72.0 in | Wt 249.5 lb

## 2024-08-20 DIAGNOSIS — F331 Major depressive disorder, recurrent, moderate: Secondary | ICD-10-CM

## 2024-08-20 DIAGNOSIS — F411 Generalized anxiety disorder: Secondary | ICD-10-CM | POA: Diagnosis not present

## 2024-08-20 DIAGNOSIS — K219 Gastro-esophageal reflux disease without esophagitis: Secondary | ICD-10-CM | POA: Diagnosis not present

## 2024-08-20 DIAGNOSIS — I10 Essential (primary) hypertension: Secondary | ICD-10-CM | POA: Diagnosis not present

## 2024-08-20 NOTE — Progress Notes (Signed)
 Name: Mason Parker   MRN: [id number]    DOB: [birth year]   Date:08/20/2024       Progress Note  Subjective  Chief Complaint  Chief Complaint  Patient presents with   Anxiety    Pt states family matters, has been out of bp meds   Discussed the use of AI scribe software for clinical note transcription with the patient, who gave verbal consent to proceed.  History of Present Illness Mason Parker is a 33 year old male who presents with high stress levels and uncontrolled hypertension.  He has been experiencing extreme stress since October due to multiple family and personal issues. A significant stressor is his dog's deteriorating health, which is causing spinal cord pressure and neurological symptoms. The dog is currently on prednisone  twice daily, but he anticipates the need for euthanasia once the medication is no longer effective.  His grandfather requires shoulder removal due to infections, and his grandmother's dementia is worsening, adding to the family stress. His brother is at risk of eviction from his uncle's house, which may lead to further familial conflict if he returns home.  His sister is dealing with her fianc's arrest for serious charges, causing significant family tension. She is liquidating assets to support his legal defense, leading to financial strain and interpersonal conflict, particularly at their shared workplace.  He has not been taking his prescribed medications for hypertension and depression, including atenolol , valsartan /hctz, escitalopram , and Wellbutrin , due to insurance and motivation issues. He recently obtained his insurance card. He reports feeling low energy and crashing after work, impacting his ability to manage his health.  He acknowledges high stress levels and reports not taking his blood pressure medications.    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   Perennial allergic rhinitis 02/25/2015   Learning disability 02/25/2015    Past Surgical History:  Procedure Laterality Date   CIRCUMCISION  1991   SHOULDER ARTHROSCOPY Right 09/09/2015   Procedure: ARTHROSCOPY SHOULDER, RIGHT SHOULDER ATHROSCOPIC ANTERIOR AND POSTERIOR LABRAL TEAR REPAIR, CAPSULORRHAPHY;  Surgeon: Franky Cranker, MD;  Location: [facility];  Service: Orthopedics;  Laterality: Right;    Family History  Problem Relation Age of Onset   Bipolar disorder Sister    Depression Brother    Hypertension Mother    Heart disease Mother    Depression Mother    Hypertension Father    Bipolar disorder Father    Hyperlipidemia Father    Obesity Father     Social History   Tobacco Use   Smoking status: Never   Smokeless tobacco: Never  Substance Use Topics   Alcohol use: No    Alcohol/week: 0.0 standard drinks of alcohol     Current Outpatient Medications:    atenolol  (TENORMIN ) 25 MG tablet, Take 1 tablet (25 mg total) by mouth daily., Disp: 90 tablet, Rfl: 1   azelastine  (ASTELIN ) 0.1 % nasal spray, Place 2 sprays into both nostrils 2 (two) times daily. Use in each nostril as directed,  Disp: 30 mL, Rfl: 2   buPROPion  (WELLBUTRIN  XL) 300 MG 24 hr tablet, Take 1 tablet (300 mg total) by mouth daily., Disp: 90 tablet, Rfl: 1   busPIRone  (BUSPAR ) 5 MG tablet, Take 1 tablet (5 mg total) by mouth daily as needed., Disp: 90 tablet, Rfl: 0   Cholecalciferol (VITAMIN D ) 50 MCG (2000 UT) CAPS, Take 1 capsule  by mouth daily., Disp: , Rfl:    escitalopram  (LEXAPRO ) 10 MG tablet, Take 1 tablet (10 mg total) by mouth daily., Disp: 90 tablet, Rfl: 1   ketoconazole  (NIZORAL ) 2 % cream, Apply 1 Application topically 2 (two) times daily as needed for irritation., Disp: 60 g, Rfl: 1   ketoconazole  (NIZORAL ) 2 % shampoo, Apply 1 Application topically 2 (two) times a week., Disp: 120 mL, Rfl: 2   Melatonin 5 MG CAPS, Take by mouth., Disp: , Rfl:    omeprazole  (PRILOSEC) 40 MG capsule, Take 1 capsule (40 mg total) by mouth daily., Disp: 90 capsule, Rfl: 1   traZODone  (DESYREL ) 50 MG tablet, Take 0.5-1 tablets (25-50 mg total) by mouth at bedtime as needed for sleep., Disp: 90 tablet, Rfl: 1   valsartan -hydrochlorothiazide  (DIOVAN -HCT) 320-25 MG tablet, Take 1 tablet by mouth daily., Disp: 90 tablet, Rfl: 1   fluticasone  (FLONASE ) 50 MCG/ACT nasal spray, Place 2 sprays into both nostrils daily., Disp: 48 g, Rfl: 0   levocetirizine (XYZAL ) 5 MG tablet, TAKE ONE TABLET BY MOUTH EVERY EVENING, Disp: 90 tablet, Rfl: 1  Allergies  Allergen Reactions   Other Swelling    YOGURT   Shellfish Allergy Hives    I personally reviewed active problem list, medication list, allergies, family history with the patient/caregiver today.   ROS  Ten systems reviewed and is negative except as mentioned in HPI    Objective Physical Exam  CONSTITUTIONAL: Patient appears well-developed and well-nourished.  No distress. HEENT: Head atraumatic, normocephalic, neck supple. CARDIOVASCULAR: Normal rate, regular rhythm and normal heart sounds.  No murmur heard. No BLE edema. PULMONARY: Effort normal and breath sounds normal. No respiratory distress. ABDOMINAL: There is no tenderness or distention. MUSCULOSKELETAL: Normal gait. Without gross motor or sensory deficit. PSYCHIATRIC: Patient has a normal mood and affect. behavior is normal. Judgment and thought content normal.  Vitals:   08/20/24 1457  BP: (!) 160/102  Pulse: 91   Resp: 16  SpO2: 97%  Weight: 249 lb 8 oz (113.2 kg)  Height: 6' (1.829 m)    Body mass index is 33.84 kg/m.   PHQ2/9:    08/20/2024    3:02 PM 04/16/2024    3:07 PM 11/01/2023    3:05 PM 05/04/2023    3:02 PM 01/30/2023    3:03 PM  Depression screen PHQ 2/9  Decreased Interest 2 3 3 2 3   Down, Depressed, Hopeless 2 3 3 1 2   PHQ - 2 Score 4 6 6 3 5   Altered sleeping 1 3 3 3 3   Tired, decreased energy 3 3 3 1 3   Change in appetite 3 3 0 0 1  Feeling bad or failure about yourself  0 0 0 0 2  Trouble concentrating 2 3 3  0 3  Moving slowly or fidgety/restless 0 0 0 0 0  Suicidal thoughts 0 0 0 0 0  PHQ-9 Score 13 18  15  7  17    Difficult doing work/chores Very difficult Very difficult Very difficult       Data saved with a previous flowsheet row definition  phq 9 is positive  Fall Risk:    08/20/2024    2:53 PM 04/16/2024    3:03 PM 11/01/2023    3:03 PM 05/04/2023    3:02 PM 01/30/2023    3:03 PM  Fall Risk   Falls in the past year? 0 0 0 0 0  Number falls in past yr: 0 0 0 0 0  Injury with Fall? 0 0  0  0  0   Risk for fall due to : No Fall Risks No Fall Risks No Fall Risks No Fall Risks No Fall Risks  Follow up Falls evaluation completed Falls evaluation completed Falls prevention discussed;Education provided;Falls evaluation completed Falls prevention discussed Falls prevention discussed     Data saved with a previous flowsheet row definition    Assessment & Plan Generalized anxiety disorder and recurrent major depressive disorder Moderate recurrent major depression with PHQ-9 score of 13. Stressors include family issues, financial strain, and anticipatory grief. Non-adherence to Wellbutrin  and escitalopram  due to stress and lack of motivation. Anxiety worsened by family and work stress. - Resume Wellbutrin  XL 300 mg daily. - Resume escitalopram  10 mg daily. - Resume trazodone  for sleep. - Resume buspar  for anxiety  - Consider [facility] for medication  delivery.  Uncontrolled essential hypertension Blood pressure at 160/108 mmHg. Non-adherence to atenolol  and valsartan  due to stress and logistical issues. Increased risk of stroke and heart attack. - Resume atenolol  25 mg daily. - Resume valsartan /hctz  320/25 mg daily. - Consider [facility] for medication delivery.  GERD - resume PPI

## 2024-10-03 ENCOUNTER — Ambulatory Visit: Admitting: Family Medicine

## 2024-10-20 ENCOUNTER — Ambulatory Visit: Payer: PRIVATE HEALTH INSURANCE | Admitting: Family Medicine
